# Patient Record
Sex: Female | Born: 1995 | Race: White | Hispanic: Yes | Marital: Single | Smoking: Never smoker
Health system: Southern US, Community
[De-identification: ages and names within clinical notes are randomized; demographics above are authoritative.]

---

## 2020-07-12 ENCOUNTER — Other Ambulatory Visit: Payer: Self-pay

## 2020-07-12 ENCOUNTER — Inpatient Hospital Stay (HOSPITAL_COMMUNITY)
Admission: AD | Admit: 2020-07-12 | Discharge: 2020-07-12 | Disposition: A | Discharge status: Home / Self Care | Payer: Medicaid Other | Attending: Obstetrics & Gynecology | Admitting: Obstetrics & Gynecology

## 2020-07-12 ENCOUNTER — Encounter (HOSPITAL_COMMUNITY): Payer: Self-pay | Admitting: Obstetrics & Gynecology

## 2020-07-12 DIAGNOSIS — Z3492 Encounter for supervision of normal pregnancy, unspecified, second trimester: Secondary | ICD-10-CM

## 2020-07-12 DIAGNOSIS — R109 Unspecified abdominal pain: Secondary | ICD-10-CM | POA: Diagnosis not present

## 2020-07-12 DIAGNOSIS — O26891 Other specified pregnancy related conditions, first trimester: Secondary | ICD-10-CM | POA: Diagnosis not present

## 2020-07-12 DIAGNOSIS — Z3A14 14 weeks gestation of pregnancy: Secondary | ICD-10-CM

## 2020-07-12 LAB — URINALYSIS, ROUTINE W REFLEX MICROSCOPIC
Bacteria, UA: NONE SEEN
Bilirubin Urine: NEGATIVE
Glucose, UA: NEGATIVE mg/dL
Hgb urine dipstick: NEGATIVE
Ketones, ur: NEGATIVE mg/dL
Leukocytes,Ua: NEGATIVE
Nitrite: NEGATIVE
Protein, ur: NEGATIVE mg/dL
Specific Gravity, Urine: 1.017 (ref 1.005–1.030)
pH: 8 (ref 5.0–8.0)

## 2020-07-12 LAB — WET PREP, GENITAL
Clue Cells Wet Prep HPF POC: NONE SEEN
Sperm: NONE SEEN
Trich, Wet Prep: NONE SEEN
Yeast Wet Prep HPF POC: NONE SEEN

## 2020-07-12 LAB — POCT PREGNANCY, URINE: Preg Test, Ur: POSITIVE — AB

## 2020-07-12 NOTE — MAU Note (Signed)
Chloe Gross is a 24 y.o. at [redacted]w[redacted]d here in MAU reporting: lower abdominal pain for the past 2 hours. Pain is intermittent. No bleeding or discharge.   LMP: 03/31/20  Onset of complaint: today  Pain score: 8/10  Vitals:   07/12/20 1310  BP: 112/60  Pulse: 94  Resp: 18  Temp: 97.8 F (36.6 C)  SpO2: 100%     FHT: 161  Lab orders placed from triage: UA, UPT

## 2020-07-12 NOTE — Discharge Instructions (Signed)
Segundo trimestre de embarazo Second Trimester of Pregnancy  El segundo trimestre va desde la semana14 hasta la 27 (desde el mes 4 hasta el 6). Este suele ser el momento en el que mejor se siente. En general, las nuseas matutinas han disminuido o han desaparecido completamente. Tendr ms energa y podr aumentarle el apetito. El beb en gestacin se desarrolla rpidamente. Hacia el final del sexto mes, el beb mide aproximadamente 9 pulgadas (23 cm) y pesa alrededor de 1 libras (700 g). Es probable que sienta al beb moverse entre las 18 y 20 semanas del embarazo. Siga estas indicaciones en su casa: Medicamentos  Tome los medicamentos de venta libre y los recetados solamente como se lo haya indicado el mdico. Algunos medicamentos son seguros para tomar durante el embarazo y otros no lo son.  Tome vitaminas prenatales que contengan por lo menos 600microgramos (?g) de cido flico.  Si tiene dificultad para mover el intestino (estreimiento), tome un medicamento para ablandar las heces (laxante) si su mdico se lo autoriza. Comida y bebida   Ingiera alimentos saludables de manera regular.  No coma carne cruda ni quesos sin cocinar.  Si obtiene poca cantidad de calcio de los alimentos que ingiere, consulte a su mdico sobre la posibilidad de tomar un suplemento diario de calcio.  Evite el consumo de alimentos ricos en grasas y azcares, como los alimentos fritos y los dulces.  Si tiene malestar estomacal (nuseas) o devuelve (vomita): ? Ingiera 4 o 5comidas pequeas por da en lugar de 3abundantes. ? Intente comer algunas galletitas saladas. ? Beba lquidos entre las comidas, en lugar de hacerlo durante estas.  Para evitar el estreimiento: ? Consuma alimentos ricos en fibra, como frutas y verduras frescas, cereales integrales y frijoles. ? Beba suficiente lquido para mantener el pis (orina) claro o de color amarillo plido. Actividad  Haga ejercicios solamente como se lo haya  indicado el mdico. Interrumpa la actividad fsica si comienza a tener calambres.  No haga ejercicio si hace demasiado calor, hay demasiada humedad o se encuentra en un lugar de mucha altura (altitud alta).  Evite levantar pesos excesivos.  Use zapatos con tacones bajos. Mantenga una buena postura al sentarse y pararse.  Puede continuar teniendo relaciones sexuales, a menos que el mdico le indique lo contrario. Alivio del dolor y del malestar  Use un sostn que le brinde buen soporte si sus mamas estn sensibles.  Dese baos de asiento con agua tibia para aliviar el dolor o las molestias causadas por las hemorroides. Use una crema para las hemorroides si el mdico la autoriza.  Descanse con las piernas elevadas si tiene calambres o dolor de cintura.  Si desarrolla venas hinchadas y abultadas (vrices) en las piernas: ? Use medias de compresin o medias de descanso como se lo haya indicado el mdico. ? Levante (eleve) los pies durante 15minutos, 3 o 4veces por da. ? Limite el consumo de sal en sus alimentos. Cuidado prenatal  Escriba sus preguntas. Llvelas cuando concurra a las visitas prenatales.  Concurra a todas las visitas prenatales como se lo haya indicado el mdico. Esto es importante. Seguridad  Colquese el cinturn de seguridad cuando conduzca.  Haga una lista de los nmeros de telfono de emergencia, que incluya los nmeros de telfono de familiares, amigos, el hospital, as como los departamentos de polica y bomberos. Instrucciones generales  Consulte a su mdico sobre los alimentos que debe comer o pdale que la ayude a encontrar a quien pueda aconsejarla si necesita ese servicio.    Consulte a su mdico acerca de dnde se dictan clases prenatales cerca de donde vive. Comience las clases antes del mes 6 de embarazo.  No se d baos de inmersin en agua caliente, baos turcos ni saunas.  No se haga duchas vaginales ni use tampones o toallas higinicas perfumadas.   No mantenga las piernas cruzadas durante mucho tiempo.  Vaya al dentista si an no lo hizo. Use un cepillo de cerdas suaves para cepillarse los dientes. Psese el hilo dental suavemente.  No fume, no consuma hierbas ni beba alcohol. No tome frmacos que el mdico no haya autorizado.  No consuma ningn producto que contenga nicotina o tabaco, como cigarrillos y cigarrillos electrnicos. Si necesita ayuda para dejar de fumar, consulte al mdico.  Evite el contacto con las bandejas sanitarias de los gatos y la tierra que estos animales usan. Estos elementos contienen bacterias que pueden causar defectos congnitos al beb y la posible prdida del beb (aborto espontneo) o la muerte fetal. Comunquese con un mdico si:  Tiene clicos leves o siente presin en la parte baja del vientre.  Tiene dolor al hacer pis (orinar).  Advierte un lquido con olor ftido que proviene de la vagina.  Tiene malestar estomacal (nuseas), devuelve (vomita) o tiene deposiciones acuosas (diarrea).  Sufre un dolor persistente en el abdomen.  Siente mareos. Solicite ayuda de inmediato si:  Tiene fiebre.  Tiene una prdida de lquido por la vagina.  Tiene sangrado o pequeas prdidas vaginales.  Siente dolor intenso o clicos en el abdomen.  Sube o baja de peso rpidamente.  Tiene dificultades para recuperar el aliento y siente dolor en el pecho.  Sbitamente se le hinchan mucho el rostro, las manos, los tobillos, los pies o las piernas.  No ha sentido los movimientos del beb durante una hora.  Siente un dolor de cabeza intenso que no se alivia al tomar medicamentos.  Tiene dificultad para ver. Resumen  El segundo trimestre va desde la semana14 hasta la 27, desde el mes 4 hasta el 6. Este suele ser el momento en el que mejor se siente.  Para cuidarse y cuidar a su beb en gestacin, debe comer alimentos saludables, tomar medicamentos solamente si su mdico le indica que lo haga y hacer  actividades que sean seguras para usted y su beb.  Llame al mdico si se enferma o si nota algo inusual acerca de su embarazo. Tambin llame al mdico si necesita ayuda para saber qu alimentos debe comer o si quiere saber qu actividades puede realizar de forma segura. Esta informacin no tiene como fin reemplazar el consejo del mdico. Asegrese de hacerle al mdico cualquier pregunta que tenga. Document Revised: 04/06/2017 Document Reviewed: 04/06/2017 Elsevier Patient Education  2020 Elsevier Inc.  

## 2020-07-12 NOTE — MAU Provider Note (Signed)
History     CSN: [id number]  Arrival date and time: 07/12/20 1245   Event Date/Time   First Provider Initiated Contact with Patient 07/12/20 1422      Chief Complaint  Patient presents with  . Abdominal Pain   HPI   Spanish interpretor present   Chloe Gross is 24 y.o. female G1P0 @ [redacted]w[redacted]d here in MAU with complaints of abdominal pain that started 2 hours ago.  She has not started prenatal care as of today. She attempted to call the HD and was not able to make an appointment. She was told to call adopt a mom however was not given a phone number to call. The pain in her belly comes and goes. She has not taken anything for the pain in her belly.   Patient is requesting an [place] to determine how far along she is.     OB History    Gravida  1   Para      Term      Preterm      AB      Living        SAB      IAB      Ectopic      Multiple      Live Births              History reviewed. No pertinent past medical history.  History reviewed. No pertinent surgical history.  History reviewed. No pertinent family history.  Social History   Tobacco Use  . Smoking status: Never Smoker  . Smokeless tobacco: Never Used  Substance Use Topics  . Alcohol use: Not Currently  . Drug use: Never    Allergies: No Known Allergies  No medications prior to admission.   Results for orders placed or performed during the hospital encounter of 07/12/20 (from the past 48 hour(s))  Urinalysis, Routine w reflex microscopic Urine, Clean Catch     Status: Abnormal   Collection Time: 07/12/20  1:07 PM  Result Value Ref Range   Color, Urine YELLOW YELLOW   APPearance TURBID (A) CLEAR   Specific Gravity, Urine 1.017 1.005 - 1.030   pH 8.0 5.0 - 8.0   Glucose, UA NEGATIVE NEGATIVE mg/dL   Hgb urine dipstick NEGATIVE NEGATIVE   Bilirubin Urine NEGATIVE NEGATIVE   Ketones, ur NEGATIVE NEGATIVE mg/dL   Protein, ur NEGATIVE NEGATIVE mg/dL   Nitrite NEGATIVE  NEGATIVE   Leukocytes,Ua NEGATIVE NEGATIVE   RBC / HPF 0-5 0 - 5 RBC/hpf   WBC, UA 11-20 0 - 5 WBC/hpf   Bacteria, UA NONE SEEN NONE SEEN   Squamous Epithelial / LPF 6-10 0 - 5   Amorphous Crystal PRESENT     Comment: Performed at [facility], [address], [place], [place] [address]  Pregnancy, urine POC     Status: Abnormal   Collection Time: 07/12/20  1:16 PM  Result Value Ref Range   Preg Test, Ur POSITIVE (A) NEGATIVE    Comment:        THE SENSITIVITY OF THIS METHODOLOGY IS >24 mIU/mL   Wet prep, genital     Status: Abnormal   Collection Time: 07/12/20  2:30 PM   Specimen: Cervix  Result Value Ref Range   Yeast Wet Prep HPF POC NONE SEEN NONE SEEN   Trich, Wet Prep NONE SEEN NONE SEEN   Clue Cells Wet Prep HPF POC NONE SEEN NONE SEEN   WBC, Wet Prep HPF POC  MANY (A) NONE SEEN   Sperm NONE SEEN     Comment: Performed at [facility], [address], [place], [place] [address]   Review of Systems  Constitutional: Negative for fever.  Gastrointestinal: Negative for abdominal pain.  Genitourinary: Negative for vaginal bleeding and vaginal discharge.   Physical Exam   Blood pressure 112/60, pulse 94, temperature 97.8 F (36.6 C), temperature source Oral, resp. rate 18, weight 49.4 kg, last menstrual period 03/31/2020, SpO2 100 %.  Physical Exam Constitutional:      General: She is not in acute distress.    Appearance: She is well-developed. She is not toxic-appearing.  HENT:     Head: Normocephalic.  Abdominal:     Tenderness: There is no abdominal tenderness.  Genitourinary:    Comments: Wet prep and gc collected without speculum Bimanual exam: Cervix closed Uterus non tender, enlarged  GC/Chlam, wet prep done Chaperone present for exam.    Skin:    General: Skin is warm.  Neurological:     Mental Status: She is alert and oriented to person, place, and time.    MAU Course  Procedures  Pt informed that the ultrasound is considered a  limited OB ultrasound and is not intended to be a complete ultrasound exam.  Patient also informed that the ultrasound is not being completed with the intent of assessing for fetal or placental anomalies or any pelvic abnormalities.  Explained that the purpose of today's ultrasound is to assess for  viability.  Patient acknowledges the purpose of the exam and the limitations of the study.   Active fetus   MDM  + fetal heart tones via doppler Bedside US done Interpretor present   Assessment and Plan   A:  1. Abdominal pain in pregnancy, first trimester   2. [redacted] weeks gestation of pregnancy   3. Fetal heart tones present, second trimester     P:  Discharge home in stable condition Contact information given to the [facility] Will sent a message to the office to reach out to her to establish care HD unable to see her d/t availability of appointments Return to MAU if symptoms worsen  Aveyah Greenwood, Harolyn Rutherford, NP 07/12/2020 4:24 PM

## 2020-07-13 LAB — CULTURE, OB URINE: Culture: NO GROWTH

## 2020-07-14 LAB — GC/CHLAMYDIA PROBE AMP (~~LOC~~) NOT AT ARMC
Chlamydia: NEGATIVE
Comment: NEGATIVE
Comment: NORMAL
Neisseria Gonorrhea: NEGATIVE

## 2020-07-26 NOTE — L&D Delivery Note (Signed)
OB/GYN Faculty Practice Delivery Note  Chloe Gross is a 25 y.o. G1P1001 s/p VD with repair of 2nd degree vaginal laceration at [redacted]w[redacted]d. She was admitted for latent labor and subsequently augmented with AROM and pitocin.   ROM: 7h 35m with clear fluid GBS Status: Negative Maximum Maternal Temperature: 99.2  Labor Progress: Patient arrived at 2.5cm and was augmented with pitocin.  Once reaching 5cm she was AROM'd   Delivery Date/Time: February 12, 2021 at 0954 Delivery: Called to room and patient was complete and pushing. Head delivered in ROA with manual restitution to LOT. Shoulders delivered easily and infant with good tone and spontaneous cry. Tactile stimulation given by provider and infant placed on mother's abdomen where nurse continued tactile stimulation. Infant with spontaneous cry, placed on mother's abdomen, dried and stimulated. Cord clamped x 2 after 1-minute delay, and cut by provider. Cord blood drawn. Placenta delivered spontaneously with gentle cord traction. Fundus firm with massage and Pitocin started. Labia, perineum, vagina, and cervix inspected . Vaginal inspection revealed a 2nd degree vaginal laceration that was repaired with 3-0 vicryl on CT-1.  No additional anesthetic necessary and patient tolerated the procedure well. Mother hemodynamically stable and infant skin to skin prior to provider exit.  Mother desires Depo for birth control and opts to breastfeed.  Infant weight at one hour of life: 7lbs 1.6oz, 21 in   Placenta: Disposal Complications: None Lacerations: 2nd Degree Vaginal EBL: 274 Analgesia: Epidural   Infant: Female-Hannah  APGARs 9/9  3220g (7lbs 1.6oz)  Cherre Robins, CNM  02/12/2021 11:29 AM

## 2020-08-04 ENCOUNTER — Other Ambulatory Visit: Payer: Self-pay

## 2020-08-04 ENCOUNTER — Ambulatory Visit (INDEPENDENT_AMBULATORY_CARE_PROVIDER_SITE_OTHER): Payer: Self-pay | Admitting: *Deleted

## 2020-08-04 DIAGNOSIS — Z3490 Encounter for supervision of normal pregnancy, unspecified, unspecified trimester: Secondary | ICD-10-CM | POA: Insufficient documentation

## 2020-08-04 DIAGNOSIS — Z789 Other specified health status: Secondary | ICD-10-CM

## 2020-08-04 DIAGNOSIS — O0930 Supervision of pregnancy with insufficient antenatal care, unspecified trimester: Secondary | ICD-10-CM | POA: Insufficient documentation

## 2020-08-04 NOTE — Progress Notes (Signed)
Patient was assessed and managed by nursing staff during this encounter. I have reviewed the chart and agree with the documentation and plan.   Bernerd Limbo, CNM 08/04/2020 9:27 PM

## 2020-08-04 NOTE — Patient Instructions (Signed)
-   At our [facility], we work as an integrated team, providing care to address both physical and emotional health. Your medical provider may refer you to see our Behavioral Health Clinician (BHC) on the same day you see your medical provider, as availability permits; often scheduled virtually at your convenience.  Our BHC is available to all patients, visits generally last between 20-30 minutes, but can be longer or shorter, depending on patient need. The BHC offers help with stress management, coping with symptoms of depression and anxiety, major life changes , sleep issues, changing risky behavior, grief and loss, life stress, working on personal life goals, and  behavioral health issues, as these all affect your overall health and wellness.  The BHC is NOT available for the following: FMLA paperwork, court-ordered evaluations, specialty assessments (custody or disability), letters to employers, or obtaining certification for an emotional support animal. The BHC does not provide long-term therapy. You have the right to refuse integrated behavioral health services, or to reschedule to see the BHC at a later date.  Confidentiality exception: If it is suspected that a child or disabled adult is being abused or neglected, we are required by law to report that to either Child Protective Services or Adult Protective Services.  If you have a diagnosis of Bipolar affective disorder, Schizophrenia, or recurrent Major depressive disorder, we will recommend that you establish care with a psychiatrist, as these are lifelong, chronic conditions, and we want your overall emotional health and medications to be more closely monitored. If you anticipate needing extended maternity leave due to mental health issues postpartum, it it recommended you inform your medical provider, so we can put in a referral to a  psychiatrist as soon as possible. The BHC is unable to recommend an extended maternity leave for mental  health issues. Your medical provider or BHC may refer you to a therapist for ongoing, traditional therapy, or to a psychiatrist, for medication management, if it would benefit your overall health. Depending on your insurance, you may have a copay to see the BHC. If you are uninsured, it is recommended that you apply for financial assistance. (Forms may be requested at the front desk for in-person visits, via MyChart, or request a form during a virtual visit).  If you see the BHC more than 6 times, you will have to complete a comprehensive clinical assessment interview with the BHC to resume integrated services.  For virtual visits with the BHC, you must be physically in the state of [place] at the time of the visit. For example, if you live in [place], you will have to do an in-person visit with the BHC, and your out-of-state insurance may not cover behavioral health services in [place]. f you are going out of the state or country for any reason, the BHC may see you virtually when you return to [place], but not while you are physically outside of [place].   

## 2020-08-04 NOTE — Progress Notes (Signed)
New OB Intake   Kamilla came to office for new ob intake  I explained I am completing New OB Intake today. We discussed her EDD of 01/05/2021 that is based on LMP of [id number]. Pt is G1/P0. I reviewed her allergies, medications, Medical/Surgical/OB history, and appropriate screenings. I informed her of [facility] services. Based on history, this is a/an uncomplicated pregnancy.  Concerns addressed today  Delivery Plans Plans to deliver at [facility].   Blood Pressure Cuff  N/A- will have all visits in office due to language barrier  Anatomy US Explained first scheduled Korea will be around 19 weeks. Called and scheduled for first available.  Anatomy US scheduled for 08/19/20 at 245. Pt notified to arrive at 230.  Labs Discussed Avelina Laine genetic screening with patient. Declines Panor[facility] and Horizon  Routine prenatal labs drawn today.  WIC Interested in [facility] referral. Referral sent.    First visit review I reviewed new OB appt with pt. I explained she will have a pelvic exam,  and PAP smear. Explained pt will be seen by Dan Humphreys, CNM  at first visit; encounter routed to appropriate provider.  Kiari Hosmer,RN 08/04/2020  3:27 PM

## 2020-08-05 LAB — CBC/D/PLT+RPR+RH+ABO+RUB AB...
Antibody Screen: NEGATIVE
Basophils Absolute: 0.1 10*3/uL (ref 0.0–0.2)
Basos: 1 %
EOS (ABSOLUTE): 0.1 10*3/uL (ref 0.0–0.4)
Eos: 1 %
HCV Ab: <0.1 s/co ratio (ref 0.0–0.9)
HIV Screen 4th Generation wRfx: NONREACTIVE
Hematocrit: 38 % (ref 34.0–46.6)
Hemoglobin: 13 g/dL (ref 11.1–15.9)
Hepatitis B Surface Ag: NEGATIVE
Immature Grans (Abs): 0.1 10*3/uL (ref 0.0–0.1)
Immature Granulocytes: 1 %
Lymphocytes Absolute: 2.4 10*3/uL (ref 0.7–3.1)
Lymphs: 19 %
MCH: 30.4 pg (ref 26.6–33.0)
MCHC: 34.2 g/dL (ref 31.5–35.7)
MCV: 89 fL (ref 79–97)
Monocytes Absolute: 0.7 10*3/uL (ref 0.1–0.9)
Monocytes: 5 %
Neutrophils Absolute: 9.4 10*3/uL — ABNORMAL HIGH (ref 1.4–7.0)
Neutrophils: 73 %
Platelets: 331 10*3/uL (ref 150–450)
RBC: 4.27 x10E6/uL (ref 3.77–5.28)
RDW: 13.1 % (ref 11.7–15.4)
RPR Ser Ql: NONREACTIVE
Rh Factor: POSITIVE
Rubella Antibodies, IGG: 2.43 index (ref >0.99)
WBC: 12.6 10*3/uL — ABNORMAL HIGH (ref 3.4–10.8)

## 2020-08-05 LAB — HEMOGLOBIN A1C
Est. average glucose Bld gHb Est-mCnc: 100 mg/dL
Hgb A1c MFr Bld: 5.1 % (ref 4.8–5.6)

## 2020-08-05 LAB — HCV INTERPRETATION

## 2020-08-07 ENCOUNTER — Ambulatory Visit (INDEPENDENT_AMBULATORY_CARE_PROVIDER_SITE_OTHER): Payer: Self-pay | Admitting: Certified Nurse Midwife

## 2020-08-07 ENCOUNTER — Encounter: Payer: Self-pay | Admitting: Certified Nurse Midwife

## 2020-08-07 ENCOUNTER — Other Ambulatory Visit: Payer: Self-pay

## 2020-08-07 VITALS — BP 106/70 | HR 101 | Wt 112.4 lb

## 2020-08-07 DIAGNOSIS — Z3490 Encounter for supervision of normal pregnancy, unspecified, unspecified trimester: Secondary | ICD-10-CM

## 2020-08-07 DIAGNOSIS — Z3A18 18 weeks gestation of pregnancy: Secondary | ICD-10-CM

## 2020-08-07 NOTE — Patient Instructions (Signed)
Segundo trimestre de embarazo Second Trimester of Pregnancy  El segundo trimestre de embarazo va desde la semana 13 hasta la semana 27. Tambin se dice que va desde el mes 4 hasta el mes 6 de embarazo. Este suele ser el momento en el que mejor se siente. Durante el segundo trimestre:  Las nuseas del embarazo han disminuido o han desaparecido.  Usted puede tener ms energa.  Usted puede tener hambre con ms frecuencia. En esta poca, el beb en gestacin (feto) crece muy rpido. Hacia el final del sexto mes, el beb en gestacin puede medir aproximadamente 12 pulgadas y pesar alrededor de 1 libras. Es probable que comience a sentir que el beb se mueve entre las 16 y las 20 semanas de embarazo. Cambios en el cuerpo durante el segundo trimestre Su organismo contina atravesando por muchos cambios durante este perodo. Los cambios varan y generalmente vuelven a la normalidad despus del nacimiento del beb. Cambios fsicos  Aumentar ms peso.  Podrn aparecer las primeras estras en las caderas, el vientre (abdomen) y las mamas.  Las mamas crecern y pueden doler.  Pueden aparecer zonas oscuras o manchas en el rostro.  Es posible que se forme una lnea oscura desde el ombligo hasta la zona del pubis (linea nigra).  Tal vez haya cambios en el cabello. Cambios en la salud  Es posible que tenga dolores de cabeza.  Es posible que tenga acidez estomacal.  Es posible que tenga dificultades para defecar (estreimiento).  Es posible que tenga hemorroides o venas abultadas e hinchadas (venas varicosas).  Las encas pueden sangrarle.  Es posible que haga pis (orine) con mayor frecuencia.  Puede sentir dolor en la espalda. Siga estas instrucciones en su casa: Medicamentos  Use los medicamentos de venta libre y los recetados solamente como se lo haya indicado el mdico. Algunos medicamentos no son seguros durante el embarazo.  Tome vitaminas prenatales que contengan por lo menos  600microgramos (mcg) de cido flico. Comida y bebida  Consuma comidas saludables que incluyan lo siguiente: ? Frutas y verduras frescas. ? Cereales integrales. ? Buenas fuentes de protenas, como carne, huevos y tofu. ? Productos lcteos con bajo contenido de grasa.  Evite la carne cruda y el jugo, la leche y el queso sin pasteurizar.  Es posible que deba tomar medidas para prevenir o tratar los problemas para defecar: ? Beber suficiente lquido para mantener el pis (orina) de color amarillo plido. ? Come alimentos ricos en fibra. Entre ellos, frijoles, cereales integrales y frutas y verduras frescas. ? Limitar los alimentos con alto contenido de grasa y azcar. Estos incluyen alimentos fritos o dulces. Actividad  Haga ejercicios solamente como se lo haya indicado el mdico. La mayora de las personas pueden realizar su actividad fsica habitual durante el embarazo. Intente realizar como mnimo 30minutos de actividad fsica por lo menos 5das a la semana.  Deje de hacer ejercicio si tiene dolor o clicos en el vientre o en la zona lumbar.  No haga ejercicio si hace demasiado calor, hay demasiada humedad o se encuentra en un lugar de mucha altura (altitud elevada).  Evite levantar pesos excesivos.  Si lo desea, puede continuar teniendo relaciones sexuales, a menos que el mdico le indique lo contrario. Alivio del dolor y del malestar  Use un sostn que le brinde buen soporte si le duelen las mamas.  Dese baos de asiento con agua tibia para aliviar el dolor o las molestias causadas por las hemorroides. Use una crema para las hemorroides si   el mdico la autoriza.  Descanse con las piernas levantadas (elevadas) si tiene calambres en las piernas o dolor en la parte baja de la espalda.  Si desarrolla venas abultadas en las piernas: ? Use medias de compresin segn las indicaciones de su mdico. ? Levante los pies durante 15minutos, 3 o 4veces por da. ? Limite la sal en sus  alimentos. Seguridad  Use el cinturn de seguridad en todo momento mientras vaya en auto.  Hable con el mdico si alguien le est haciendo dao o gritando mucho. Estilo de vida  No se d baos de inmersin en agua caliente, baos turcos ni saunas.  No se haga duchas vaginales. No use tampones ni toallas higinicas perfumadas.  Evite el contacto con las bandejas sanitarias de los gatos y la tierra que estos animales usan. Estos contienen grmenes que pueden daar al beb y causar la prdida del beb ya sea aborto espontneo o muerte fetal.  No consuma medicamentos a base de hierbas, drogas ilegales, ni medicamentos que el mdico no haya autorizado. No beba alcohol.  No fume ni consuma ningn producto que contenga nicotina o tabaco. Si necesita ayuda para dejar de fumar, consulte al mdico. Instrucciones generales  Cumpla con todas las visitas de seguimiento. Esto es importante.  Consulte a su mdico acerca de dnde se dictan clases prenatales cerca de donde vive.  Consulte a su mdico sobre los alimentos que debe comer o pdale que la ayude a encontrar a un asesor. Dnde buscar ms informacin  American Pregnancy Association (Asociacin Americana del Embarazo): americanpregnancy.org  American College of Obstetricians and Gynecologists (Colegio Estadounidense de Obstetras y Gineclogos): www.acog.org  Office on Women's Health (Oficina para la Salud de la Mujer): womenshealth.gov/pregnancy Comunquese con un mdico si:  Tiene un dolor de cabeza que no desaparece despus de tomar analgsicos.  Nota cambios en la visin o ve manchas delante de los ojos.  Tiene clicos o siente presin o dolor leves en la parte baja del vientre.  Sigue sintiendo como si fuera a vomitar (nuseas), vomita o hace deposiciones acuosas (diarrea).  Advierte lquido con mal olor que proviene de la vagina.  Siente dolor al orinar o hace orina con mal olor.  Tiene una gran hinchazn en la cara, las  manos, las piernas, los tobillos o los pies.  Tiene fiebre. Solicite ayuda de inmediato si:  Tiene una prdida de lquido por la vagina.  Tiene sangrado o pequeas prdidas vaginales.  Tiene clicos o dolor muy intensos en el vientre.  Tiene dificultad para respirar.  Sientes dolor en el pecho.  Se desmaya.  No ha sentido que el beb se moviera durante el perodo de tiempo que le dijo el mdico.  Tiene dolor, hinchazn o enrojecimiento nuevos en un brazo o una pierna o se produce un aumento de alguno de estos sntomas. Resumen  El segundo trimestre de embarazo va desde la semana 13 hasta la 27 (desde el mes 4 hasta el 6).  Consuma comidas saludables.  Haga ejercicios tal como le indic el mdico. La mayora de las personas pueden realizar su actividad fsica habitual durante el embarazo.  No consuma medicamentos a base de hierbas, drogas ilegales, ni medicamentos que el mdico no haya autorizado. No beba alcohol.  Llame al mdico si se enferma o si nota algo inusual acerca de su embarazo. Esta informacin no tiene como fin reemplazar el consejo del mdico. Asegrese de hacerle al mdico cualquier pregunta que tenga. Document Revised: 01/25/2020 Document Reviewed: 01/25/2020 Elsevier Patient Education    2021 Elsevier Inc.  

## 2020-08-07 NOTE — Progress Notes (Signed)
History:   Chloe Gross is a 25 y.o. G1P0 at [redacted]w[redacted]d by LMP being seen today for her first obstetrical visit.  Her obstetrical history is not significant.Patient does intend to breast and bottle feed. Pregnancy history fully reviewed.   Patient reports occasional nausea, not enough to cause vomiting and no other complaints.      HISTORY: OB History  Gravida Para Term Preterm AB Living  1 0 0 0 0 0  SAB IAB Ectopic Multiple Live Births  0 0 0 0 0    # Outcome Date GA Lbr Len/2nd Weight Sex Delivery Anes PTL Lv  1 Current             Pt has never had a pap smear (age), will perform at postpartum visit.  History reviewed. No pertinent past medical history. History reviewed. No pertinent surgical history. History reviewed. No pertinent family history. Social History   Tobacco Use  . Smoking status: Never Smoker  . Smokeless tobacco: Never Used  Vaping Use  . Vaping Use: Never used  Substance Use Topics  . Alcohol use: Not Currently    Comment: at times  . Drug use: Never   No Known Allergies Current Outpatient Medications on File Prior to Visit  Medication Sig Dispense Refill  . Prenatal Vit-Fe Fumarate-FA (PRENATAL VITAMINS PO) Take 1 tablet by mouth daily.     No current facility-administered medications on file prior to visit.    Review of Systems Pertinent items noted in HPI and remainder of comprehensive ROS otherwise negative. Physical Exam:   Vitals:   08/07/20 1401  BP: 106/70  Pulse: (!) 101  Weight: 112 lb 6.4 oz (51 kg)   Fetal Heart Rate (bpm): 145  Uterus:  Fundal Height: 18 cm  Pelvic Exam: Perineum: Pelvic exam deferred   Vulva:    Vagina:     Cervix:    Adnexa:    Bony Pelvis: average  System: General: well-developed, well-nourished female in no acute distress   Breasts:  normal appearance, no masses or tenderness bilaterally   Skin: normal coloration and turgor, no rashes   Neurologic: oriented, normal, negative, normal mood    Extremities: normal strength, tone, and muscle mass, ROM of all joints is normal   HEENT PERRLA, extraocular movement intact and sclera clear, anicteric   Mouth/Teeth mucous membranes moist, pharynx normal without lesions and dental hygiene good   Neck supple and no masses   Cardiovascular: regular rate and rhythm   Respiratory:  no respiratory distress, normal breath sounds   Abdomen: soft, non-tender; bowel sounds normal; no masses,  no organomegaly    Assessment:    Pregnancy: G1P0 Patient Active Problem List   Diagnosis Date Noted  . Supervision of low-risk pregnancy 08/04/2020  . Language barrier 08/04/2020  . Late prenatal care affecting pregnancy, antepartum 08/04/2020     Plan:    1. Initial encounter for supervision of low-risk pregnancy, antepartum - Initial labs reviewed (drawn at [facility] intake visit) - Continue prenatal vitamins. - Anticipatory guidance about prenatal visits given including labs, ultrasounds, and testing. - Discussed usage of Babyscripts and virtual visits as additional source of managing and completing prenatal visits in midst of coronavirus and pandemic.   - Encouraged to complete MyChart Registration for her ability to review results, send requests, and have questions addressed.  - The nature of [facility] [facility] with multiple MDs and Advanced Practice Providers was explained to patient; also emphasized that  residents, students are part of our team. - Routine obstetric precautions reviewed. Encouraged to seek out care at office or emergency room [facility] MAU preferred) for urgent and/or emergent concerns. - Reassured that live or virtual interpretation is available at all office and hospital visits   2. [redacted] weeks gestation of pregnancy - Pt doing well, not feeling movement yet - movement heart/felt during doppler - Genetic Screening discussed, First trimester screen, Quad screen and NIPS: declined. Ultrasound  discussed; fetal anatomic survey: ordered for 08/19/20.  IReturn in about 4 weeks (around 09/04/2020) for IN-PERSON, LOB.     Edd Arbour, MSN, CNM, IBCLC Certified Nurse Midwife, [facility]

## 2020-08-14 DIAGNOSIS — O99311 Alcohol use complicating pregnancy, first trimester: Secondary | ICD-10-CM | POA: Diagnosis not present

## 2020-08-14 DIAGNOSIS — Z789 Other specified health status: Secondary | ICD-10-CM | POA: Diagnosis not present

## 2020-08-14 DIAGNOSIS — Z3402 Encounter for supervision of normal first pregnancy, second trimester: Secondary | ICD-10-CM | POA: Diagnosis not present

## 2020-08-14 DIAGNOSIS — O0930 Supervision of pregnancy with insufficient antenatal care, unspecified trimester: Secondary | ICD-10-CM | POA: Diagnosis not present

## 2020-08-19 ENCOUNTER — Other Ambulatory Visit: Payer: Self-pay | Admitting: Certified Nurse Midwife

## 2020-08-19 ENCOUNTER — Ambulatory Visit: Payer: Medicaid Other | Attending: Certified Nurse Midwife

## 2020-08-19 ENCOUNTER — Other Ambulatory Visit: Payer: Self-pay

## 2020-08-19 DIAGNOSIS — Z789 Other specified health status: Secondary | ICD-10-CM | POA: Diagnosis not present

## 2020-08-19 DIAGNOSIS — Z3490 Encounter for supervision of normal pregnancy, unspecified, unspecified trimester: Secondary | ICD-10-CM

## 2020-08-19 DIAGNOSIS — Z603 Acculturation difficulty: Secondary | ICD-10-CM

## 2020-08-20 ENCOUNTER — Other Ambulatory Visit: Payer: Self-pay | Admitting: *Deleted

## 2020-08-20 DIAGNOSIS — Z3492 Encounter for supervision of normal pregnancy, unspecified, second trimester: Secondary | ICD-10-CM

## 2020-09-04 ENCOUNTER — Encounter: Payer: Self-pay | Admitting: Certified Nurse Midwife

## 2020-09-11 ENCOUNTER — Other Ambulatory Visit: Payer: Self-pay

## 2020-09-16 ENCOUNTER — Other Ambulatory Visit: Payer: Self-pay

## 2020-09-17 ENCOUNTER — Other Ambulatory Visit: Payer: Self-pay | Admitting: *Deleted

## 2020-09-17 ENCOUNTER — Ambulatory Visit: Payer: Self-pay | Admitting: *Deleted

## 2020-09-17 ENCOUNTER — Encounter: Payer: Self-pay | Admitting: *Deleted

## 2020-09-17 ENCOUNTER — Ambulatory Visit: Payer: Medicaid Other | Attending: Obstetrics

## 2020-09-17 ENCOUNTER — Other Ambulatory Visit: Payer: Self-pay

## 2020-09-17 DIAGNOSIS — O0930 Supervision of pregnancy with insufficient antenatal care, unspecified trimester: Secondary | ICD-10-CM | POA: Insufficient documentation

## 2020-09-17 DIAGNOSIS — O322XX0 Maternal care for transverse and oblique lie, not applicable or unspecified: Secondary | ICD-10-CM | POA: Diagnosis not present

## 2020-09-17 DIAGNOSIS — Z363 Encounter for antenatal screening for malformations: Secondary | ICD-10-CM

## 2020-09-17 DIAGNOSIS — Z362 Encounter for other antenatal screening follow-up: Secondary | ICD-10-CM

## 2020-09-17 DIAGNOSIS — Z3A19 19 weeks gestation of pregnancy: Secondary | ICD-10-CM | POA: Diagnosis not present

## 2020-09-17 DIAGNOSIS — Z789 Other specified health status: Secondary | ICD-10-CM

## 2020-09-17 DIAGNOSIS — Z3492 Encounter for supervision of normal pregnancy, unspecified, second trimester: Secondary | ICD-10-CM | POA: Insufficient documentation

## 2020-09-17 DIAGNOSIS — O358XX0 Maternal care for other (suspected) fetal abnormality and damage, not applicable or unspecified: Secondary | ICD-10-CM

## 2020-09-17 DIAGNOSIS — Z3687 Encounter for antenatal screening for uncertain dates: Secondary | ICD-10-CM

## 2020-10-16 ENCOUNTER — Ambulatory Visit: Payer: Medicaid Other | Admitting: *Deleted

## 2020-10-16 ENCOUNTER — Encounter: Payer: Self-pay | Admitting: *Deleted

## 2020-10-16 ENCOUNTER — Ambulatory Visit: Payer: Medicaid Other | Attending: Obstetrics and Gynecology

## 2020-10-16 ENCOUNTER — Other Ambulatory Visit: Payer: Self-pay

## 2020-10-16 DIAGNOSIS — Z789 Other specified health status: Secondary | ICD-10-CM | POA: Insufficient documentation

## 2020-10-16 DIAGNOSIS — Z3687 Encounter for antenatal screening for uncertain dates: Secondary | ICD-10-CM

## 2020-10-16 DIAGNOSIS — Z3A23 23 weeks gestation of pregnancy: Secondary | ICD-10-CM | POA: Diagnosis not present

## 2020-10-16 DIAGNOSIS — O358XX0 Maternal care for other (suspected) fetal abnormality and damage, not applicable or unspecified: Secondary | ICD-10-CM | POA: Diagnosis not present

## 2020-10-16 DIAGNOSIS — Z362 Encounter for other antenatal screening follow-up: Secondary | ICD-10-CM | POA: Diagnosis present

## 2020-10-16 DIAGNOSIS — O0932 Supervision of pregnancy with insufficient antenatal care, second trimester: Secondary | ICD-10-CM

## 2020-10-16 DIAGNOSIS — O0930 Supervision of pregnancy with insufficient antenatal care, unspecified trimester: Secondary | ICD-10-CM | POA: Diagnosis present

## 2020-11-20 DIAGNOSIS — Z789 Other specified health status: Secondary | ICD-10-CM | POA: Diagnosis not present

## 2020-11-20 DIAGNOSIS — O99311 Alcohol use complicating pregnancy, first trimester: Secondary | ICD-10-CM | POA: Diagnosis not present

## 2020-11-20 DIAGNOSIS — O0930 Supervision of pregnancy with insufficient antenatal care, unspecified trimester: Secondary | ICD-10-CM | POA: Diagnosis not present

## 2020-11-20 DIAGNOSIS — Z3403 Encounter for supervision of normal first pregnancy, third trimester: Secondary | ICD-10-CM | POA: Diagnosis not present

## 2020-12-08 DIAGNOSIS — O365930 Maternal care for other known or suspected poor fetal growth, third trimester, not applicable or unspecified: Secondary | ICD-10-CM | POA: Diagnosis not present

## 2021-01-15 DIAGNOSIS — O2603 Excessive weight gain in pregnancy, third trimester: Secondary | ICD-10-CM | POA: Diagnosis not present

## 2021-01-15 DIAGNOSIS — Z3403 Encounter for supervision of normal first pregnancy, third trimester: Secondary | ICD-10-CM | POA: Diagnosis not present

## 2021-01-15 LAB — OB RESULTS CONSOLE GBS: GBS: NEGATIVE

## 2021-01-30 ENCOUNTER — Encounter (HOSPITAL_COMMUNITY): Payer: Self-pay | Admitting: Obstetrics & Gynecology

## 2021-01-30 ENCOUNTER — Inpatient Hospital Stay (HOSPITAL_COMMUNITY)
Admission: AD | Admit: 2021-01-30 | Discharge: 2021-01-31 | Disposition: A | Discharge status: Home / Self Care | Payer: Medicaid Other | Attending: Obstetrics & Gynecology | Admitting: Obstetrics & Gynecology

## 2021-01-30 DIAGNOSIS — Z3493 Encounter for supervision of normal pregnancy, unspecified, third trimester: Secondary | ICD-10-CM

## 2021-01-30 DIAGNOSIS — U071 COVID-19: Secondary | ICD-10-CM | POA: Diagnosis not present

## 2021-01-30 DIAGNOSIS — O98513 Other viral diseases complicating pregnancy, third trimester: Secondary | ICD-10-CM | POA: Diagnosis not present

## 2021-01-30 DIAGNOSIS — Z789 Other specified health status: Secondary | ICD-10-CM

## 2021-01-30 DIAGNOSIS — Z3A39 39 weeks gestation of pregnancy: Secondary | ICD-10-CM | POA: Insufficient documentation

## 2021-01-30 DIAGNOSIS — O0930 Supervision of pregnancy with insufficient antenatal care, unspecified trimester: Secondary | ICD-10-CM

## 2021-01-30 DIAGNOSIS — O471 False labor at or after 37 completed weeks of gestation: Secondary | ICD-10-CM

## 2021-01-30 DIAGNOSIS — Z3A37 37 weeks gestation of pregnancy: Secondary | ICD-10-CM | POA: Insufficient documentation

## 2021-01-30 LAB — BASIC METABOLIC PANEL
Anion gap: 8 (ref 5–15)
BUN: <5 mg/dL — ABNORMAL LOW (ref 6–20)
CO2: 21 mmol/L — ABNORMAL LOW (ref 22–32)
Calcium: 8.8 mg/dL — ABNORMAL LOW (ref 8.9–10.3)
Chloride: 103 mmol/L (ref 98–111)
Creatinine, Ser: 0.48 mg/dL (ref 0.44–1.00)
GFR, Estimated: >60 mL/min (ref >60)
Glucose, Bld: 104 mg/dL — ABNORMAL HIGH (ref 70–99)
Potassium: 3.8 mmol/L (ref 3.5–5.1)
Sodium: 132 mmol/L — ABNORMAL LOW (ref 135–145)

## 2021-01-30 LAB — CBC WITH DIFFERENTIAL/PLATELET
Abs Immature Granulocytes: 0.14 10*3/uL — ABNORMAL HIGH (ref 0.00–0.07)
Basophils Absolute: 0 10*3/uL (ref 0.0–0.1)
Basophils Relative: 0 %
Eosinophils Absolute: 0 10*3/uL (ref 0.0–0.5)
Eosinophils Relative: 0 %
HCT: 34.5 % — ABNORMAL LOW (ref 36.0–46.0)
Hemoglobin: 11.4 g/dL — ABNORMAL LOW (ref 12.0–15.0)
Immature Granulocytes: 1 %
Lymphocytes Relative: 7 %
Lymphs Abs: 0.7 10*3/uL (ref 0.7–4.0)
MCH: 29.2 pg (ref 26.0–34.0)
MCHC: 33 g/dL (ref 30.0–36.0)
MCV: 88.5 fL (ref 80.0–100.0)
Monocytes Absolute: 1 10*3/uL (ref 0.1–1.0)
Monocytes Relative: 10 %
Neutro Abs: 8.6 10*3/uL — ABNORMAL HIGH (ref 1.7–7.7)
Neutrophils Relative %: 82 %
Platelets: 242 10*3/uL (ref 150–400)
RBC: 3.9 MIL/uL (ref 3.87–5.11)
RDW: 13.1 % (ref 11.5–15.5)
WBC: 10.4 10*3/uL (ref 4.0–10.5)
nRBC: 0.2 % (ref 0.0–0.2)

## 2021-01-30 LAB — RESP PANEL BY RT-PCR (FLU A&B, COVID) ARPGX2
Influenza A by PCR: NEGATIVE
Influenza B by PCR: NEGATIVE
SARS Coronavirus 2 by RT PCR: POSITIVE — AB

## 2021-01-30 MED ORDER — ACETAMINOPHEN 500 MG PO TABS
1000.0000 mg | ORAL_TABLET | Freq: Once | ORAL | Status: AC
  Administered 2021-01-30: 1000 mg via ORAL
  Filled 2021-01-30: qty 2

## 2021-01-30 MED ORDER — LACTATED RINGERS IV BOLUS
1000.0000 mL | Freq: Once | INTRAVENOUS | Status: AC
  Administered 2021-01-30: 1000 mL via INTRAVENOUS

## 2021-01-30 NOTE — MAU Note (Signed)
PT SAYS UC STRONG  SINCE - 330PM. PNC WITH HD VE YESTERDAY -  1 CM DENIES HSV GBS- UNSURE

## 2021-01-31 DIAGNOSIS — Z3A39 39 weeks gestation of pregnancy: Secondary | ICD-10-CM

## 2021-01-31 DIAGNOSIS — O471 False labor at or after 37 completed weeks of gestation: Secondary | ICD-10-CM

## 2021-01-31 DIAGNOSIS — O98513 Other viral diseases complicating pregnancy, third trimester: Secondary | ICD-10-CM | POA: Diagnosis not present

## 2021-01-31 DIAGNOSIS — U071 COVID-19: Secondary | ICD-10-CM

## 2021-01-31 NOTE — MAU Provider Note (Signed)
History     CSN: [id number]  Arrival date and time: 01/30/21 1851     Chief Complaint  Patient presents with   Contractions   Chloe Gross is a 25 y.o. year old G1P0 female at [redacted]w[redacted]d weeks gestation who presents to MAU reporting contractions, fever, congestion and not feeling well since this morning. She denies any contact with anyone with known or suspected COVID-19. She is vaccinated (#1: 06/18/2020 abd #2 07/09/2020 - both Pfizer). She receives her [facility] with [facility]; next appt is on 02/05/2021. Her friend is present and contributing to the history taking.    OB History     Gravida  1   Para      Term      Preterm      AB      Living         SAB      IAB      Ectopic      Multiple      Live Births              History reviewed. No pertinent past medical history.  History reviewed. No pertinent surgical history.  No family history on file.  Social History   Tobacco Use   Smoking status: Never   Smokeless tobacco: Never  Vaping Use   Vaping Use: Never used  Substance Use Topics   Alcohol use: Not Currently    Comment: at times   Drug use: Never    Allergies: No Known Allergies  Medications Prior to Admission  Medication Sig Dispense Refill Last Dose   Prenatal Vit-Fe Fumarate-FA (PRENATAL VITAMINS PO) Take 1 tablet by mouth daily.       Review of Systems  Constitutional:  Positive for appetite change, chills, diaphoresis, fatigue and fever.  HENT:  Positive for congestion.   Eyes: Negative.   Respiratory:  Positive for shortness of breath.   Cardiovascular: Negative.   Gastrointestinal: Negative.   Endocrine: Negative.   Genitourinary:  Positive for pelvic pain (contractions).  Musculoskeletal: Negative.   Skin: Negative.   Allergic/Immunologic: Negative.   Neurological: Negative.   Hematological: Negative.   Psychiatric/Behavioral: Negative.    Physical Exam   Patient Vitals for the past 24 hrs:  BP Temp Temp src  Pulse Resp SpO2 Weight  01/31/21 0026 (!) 95/52 98.1 F (36.7 C) Oral (!) 116 14 -- --  01/31/21 0005 -- -- -- -- -- 99 % --  01/31/21 0000 -- -- -- -- -- 98 % --  01/30/21 2355 -- -- -- -- -- 97 % --  01/30/21 2350 -- -- -- -- -- 97 % --  01/30/21 2345 -- -- -- -- -- 95 % --  01/30/21 2340 -- -- -- -- -- 97 % --  01/30/21 2335 -- -- -- -- -- 98 % --  01/30/21 2330 -- -- -- -- -- 98 % --  01/30/21 2325 -- -- -- -- -- 96 % --  01/30/21 2320 -- -- -- -- -- 98 % --  01/30/21 2315 -- -- -- -- -- 97 % --  01/30/21 2310 -- -- -- -- -- 97 % --  01/30/21 2305 -- -- -- -- -- 99 % --  01/30/21 2300 -- -- -- -- -- 99 % --  01/30/21 2138 -- 99.4 F (37.4 C) Oral -- -- -- --  01/30/21 1923 103/60 (!) 101.6 F (38.7 C) Oral (!) 149 20 -- --  01/30/21 1917 -- -- -- -- -- --  65 kg     Physical Exam Vitals and nursing note reviewed. Exam conducted with a chaperone present.  Constitutional:      Appearance: Normal appearance. She is normal weight.  HENT:     Head: Normocephalic and atraumatic.     Nose: Congestion present.  Cardiovascular:     Rate and Rhythm: Tachycardia present.     Pulses: Normal pulses.     Heart sounds: Normal heart sounds.  Pulmonary:     Effort: Pulmonary effort is normal.     Breath sounds: Normal breath sounds.  Abdominal:     Palpations: Abdomen is soft.  Genitourinary:    Comments: Dilation: 1 Effacement (%): 80 Station: -3 Exam by: Carloyn Jaeger, CNM  Musculoskeletal:     Cervical back: Normal range of motion.  Neurological:     Mental Status: She is alert.    MAU Course  Procedures  MDM CBC w/Diff COVID/Flu A&B swab BMP LR 1000 ml bolus x 2 *Consult with Dr. Charlotta Newton @ 2345 - notified of patient's complaints, assessments, lab & U/S results, recommended tx plan offer Paxlovid, f/u with [facility] as directed - ok to d/c home  Results for orders placed or performed during the hospital encounter of 01/30/21 (from the past 24 hour(s))  CBC with Differential      Status: Abnormal   Collection Time: 01/30/21  7:58 PM  Result Value Ref Range   WBC 10.4 4.0 - 10.5 K/uL   RBC 3.90 3.87 - 5.11 MIL/uL   Hemoglobin 11.4 (L) 12.0 - 15.0 g/dL   HCT 43.3 (L) 29.5 - 18.8 %   MCV 88.5 80.0 - 100.0 fL   MCH 29.2 26.0 - 34.0 pg   MCHC 33.0 30.0 - 36.0 g/dL   RDW 41.6 60.6 - 30.1 %   Platelets 242 150 - 400 K/uL   nRBC 0.2 0.0 - 0.2 %   Neutrophils Relative % 82 %   Neutro Abs 8.6 (H) 1.7 - 7.7 K/uL   Lymphocytes Relative 7 %   Lymphs Abs 0.7 0.7 - 4.0 K/uL   Monocytes Relative 10 %   Monocytes Absolute 1.0 0.1 - 1.0 K/uL   Eosinophils Relative 0 %   Eosinophils Absolute 0.0 0.0 - 0.5 K/uL   Basophils Relative 0 %   Basophils Absolute 0.0 0.0 - 0.1 K/uL   Immature Granulocytes 1 %   Abs Immature Granulocytes 0.14 (H) 0.00 - 0.07 K/uL  Basic metabolic panel     Status: Abnormal   Collection Time: 01/30/21  7:58 PM  Result Value Ref Range   Sodium 132 (L) 135 - 145 mmol/L   Potassium 3.8 3.5 - 5.1 mmol/L   Chloride 103 98 - 111 mmol/L   CO2 21 (L) 22 - 32 mmol/L   Glucose, Bld 104 (H) 70 - 99 mg/dL   BUN <5 (L) 6 - 20 mg/dL   Creatinine, Ser 6.01 0.44 - 1.00 mg/dL   Calcium 8.8 (L) 8.9 - 10.3 mg/dL   GFR, Estimated >09 >32 mL/min   Anion gap 8 5 - 15  Resp Panel by RT-PCR (Flu A&B, Covid) Nasopharyngeal Swab     Status: Abnormal   Collection Time: 01/30/21  8:22 PM   Specimen: Nasopharyngeal Swab; Nasopharyngeal(NP) swabs in vial transport medium  Result Value Ref Range   SARS Coronavirus 2 by RT PCR POSITIVE (A) NEGATIVE   Influenza A by PCR NEGATIVE NEGATIVE   Influenza B by PCR NEGATIVE NEGATIVE    Assessment and Plan  False labor after 37 weeks of gestation without delivery  - Information provided on BH ctxs   COVID-19 affecting pregnancy in third trimester  - Information provided on pregnancy and COVID-19, 10 things to manage sx of COVID at home, isolation vs quaratine - Offered Paxlovid Rx after discussion of r/b >>declined - Safe  Medications in Pregnancy list given - Advised to take OTC for colds, stay well-hydrated, wear a mask if has to be around people she lives with, drink OJ   [redacted] weeks gestation of pregnancy  Language barrier affecting health care  - [place] in-person interpreter used for assessment - [employer] Video Spanish Interpreter, Warden Fillers [phone] used for entire visit   - Discharge patient - Call [facility] on Monday 02/02/21 to see if they want you to come in person to next appt - Patient verbalized an understanding of the plan of care and agrees.   Raelyn Mora, CNM 01/30/2021, 9:40 PM

## 2021-01-31 NOTE — Discharge Instructions (Signed)

## 2021-02-09 ENCOUNTER — Encounter (HOSPITAL_COMMUNITY): Payer: Self-pay | Admitting: *Deleted

## 2021-02-09 ENCOUNTER — Telehealth (HOSPITAL_COMMUNITY): Payer: Self-pay | Admitting: *Deleted

## 2021-02-09 DIAGNOSIS — O480 Post-term pregnancy: Secondary | ICD-10-CM | POA: Diagnosis not present

## 2021-02-09 DIAGNOSIS — Z3403 Encounter for supervision of normal first pregnancy, third trimester: Secondary | ICD-10-CM | POA: Diagnosis not present

## 2021-02-09 DIAGNOSIS — O2603 Excessive weight gain in pregnancy, third trimester: Secondary | ICD-10-CM | POA: Diagnosis not present

## 2021-02-09 DIAGNOSIS — O0930 Supervision of pregnancy with insufficient antenatal care, unspecified trimester: Secondary | ICD-10-CM | POA: Diagnosis not present

## 2021-02-09 NOTE — Telephone Encounter (Signed)
Interpreter number [phone]

## 2021-02-10 DIAGNOSIS — Z3403 Encounter for supervision of normal first pregnancy, third trimester: Secondary | ICD-10-CM | POA: Diagnosis not present

## 2021-02-11 ENCOUNTER — Encounter (HOSPITAL_COMMUNITY): Payer: Self-pay | Admitting: Obstetrics & Gynecology

## 2021-02-11 ENCOUNTER — Inpatient Hospital Stay (HOSPITAL_COMMUNITY)
Admission: AD | Admit: 2021-02-11 | Discharge: 2021-02-11 | Disposition: A | Discharge status: Home / Self Care | Payer: Medicaid Other | Source: Home / Self Care | Attending: Obstetrics & Gynecology | Admitting: Obstetrics & Gynecology

## 2021-02-11 ENCOUNTER — Other Ambulatory Visit: Payer: Self-pay

## 2021-02-11 ENCOUNTER — Inpatient Hospital Stay (HOSPITAL_COMMUNITY)
Admission: AD | Admit: 2021-02-11 | Discharge: 2021-02-14 | DRG: 807 | Disposition: A | Discharge status: Home / Self Care | Payer: Medicaid Other | Attending: Obstetrics and Gynecology | Admitting: Obstetrics and Gynecology

## 2021-02-11 DIAGNOSIS — O480 Post-term pregnancy: Secondary | ICD-10-CM | POA: Insufficient documentation

## 2021-02-11 DIAGNOSIS — Z8616 Personal history of COVID-19: Secondary | ICD-10-CM

## 2021-02-11 DIAGNOSIS — T81509A Unspecified complication of foreign body accidentally left in body following unspecified procedure, initial encounter: Secondary | ICD-10-CM

## 2021-02-11 DIAGNOSIS — O471 False labor at or after 37 completed weeks of gestation: Secondary | ICD-10-CM | POA: Insufficient documentation

## 2021-02-11 DIAGNOSIS — O479 False labor, unspecified: Secondary | ICD-10-CM

## 2021-02-11 DIAGNOSIS — Z3A40 40 weeks gestation of pregnancy: Secondary | ICD-10-CM

## 2021-02-11 LAB — TYPE AND SCREEN
ABO/RH(D): O POS
Antibody Screen: NEGATIVE

## 2021-02-11 LAB — CBC
HCT: 37.5 % (ref 36.0–46.0)
Hemoglobin: 13 g/dL (ref 12.0–15.0)
MCH: 30.4 pg (ref 26.0–34.0)
MCHC: 34.7 g/dL (ref 30.0–36.0)
MCV: 87.8 fL (ref 80.0–100.0)
Platelets: 369 10*3/uL (ref 150–400)
RBC: 4.27 MIL/uL (ref 3.87–5.11)
RDW: 13.7 % (ref 11.5–15.5)
WBC: 13.1 10*3/uL — ABNORMAL HIGH (ref 4.0–10.5)
nRBC: 0 % (ref 0.0–0.2)

## 2021-02-11 MED ORDER — TERBUTALINE SULFATE 1 MG/ML IJ SOLN: 0.2500 mg | Freq: Once | INTRAMUSCULAR | Status: DC | PRN

## 2021-02-11 MED ORDER — ACETAMINOPHEN 325 MG PO TABS
650.0000 mg | ORAL_TABLET | ORAL | Status: DC | PRN
Start: 2021-02-11 — End: 2021-02-12

## 2021-02-11 MED ORDER — SODIUM CHLORIDE 0.9 % IV SOLN: 5.0000 10*6.[IU] | Freq: Once | INTRAVENOUS | Status: DC

## 2021-02-11 MED ORDER — LACTATED RINGERS IV SOLN: 500.0000 mL | INTRAVENOUS | Status: DC | PRN

## 2021-02-11 MED ORDER — ONDANSETRON HCL 4 MG/2ML IJ SOLN
4.0000 mg | Freq: Four times a day (QID) | INTRAMUSCULAR | Status: DC | PRN
  Administered 2021-02-12: 4 mg via INTRAVENOUS
  Filled 2021-02-11: qty 2

## 2021-02-11 MED ORDER — OXYTOCIN-SODIUM CHLORIDE 30-0.9 UT/500ML-% IV SOLN
2.5000 [IU]/h | INTRAVENOUS | Status: DC
  Filled 2021-02-11: qty 500

## 2021-02-11 MED ORDER — SOD CITRATE-CITRIC ACID 500-334 MG/5ML PO SOLN
30.0000 mL | ORAL | Status: DC | PRN
Start: 2021-02-11 — End: 2021-02-12

## 2021-02-11 MED ORDER — LACTATED RINGERS IV SOLN: INTRAVENOUS | Status: DC

## 2021-02-11 MED ORDER — OXYTOCIN-SODIUM CHLORIDE 30-0.9 UT/500ML-% IV SOLN
1.0000 m[IU]/min | INTRAVENOUS | Status: DC
  Administered 2021-02-11: 2 m[IU]/min via INTRAVENOUS

## 2021-02-11 MED ORDER — LIDOCAINE HCL (PF) 1 % IJ SOLN
30.0000 mL | INTRAMUSCULAR | Status: DC | PRN
Start: 2021-02-11 — End: 2021-02-12

## 2021-02-11 MED ORDER — FENTANYL CITRATE (PF) 100 MCG/2ML IJ SOLN
50.0000 ug | INTRAMUSCULAR | Status: DC | PRN
  Administered 2021-02-11: 100 ug via INTRAVENOUS
  Filled 2021-02-11: qty 2

## 2021-02-11 MED ORDER — PENICILLIN G POT IN DEXTROSE 60000 UNIT/ML IV SOLN: 3.0000 10*6.[IU] | INTRAVENOUS | Status: DC

## 2021-02-11 MED ORDER — OXYTOCIN BOLUS FROM INFUSION
333.0000 mL | Freq: Once | INTRAVENOUS | Status: AC
  Administered 2021-02-12: 333 mL via INTRAVENOUS

## 2021-02-11 NOTE — Progress Notes (Addendum)
Labor Progress Note Chloe Gross is a 25 y.o. G1P0 at [redacted]w[redacted]d presented for SOL-w/ NRNST. S: Patient is standing in some discomfort from contractions.   Interpreter in room.   O:  BP 116/66   Pulse 80   Temp 99.2 F (37.3 C) (Oral)   Resp 18   LMP 03/31/2020   SpO2 100%  EFM: baseline 150 BPM/min-mod variability/+accels/-decels  Toco: contractions Q4-7 min  CVE: Dilation: 3 Effacement (%): 90 Station: 0, Plus 1 Presentation: Vertex Exam by:: Warrick Parisian, resident   A&P: 25 y.o. G1P0 [redacted]w[redacted]d presented for SOL-w/ NRNST. #Labor: Will start pitocin to augment the labor process due to lack of change. Next check in 5-6 hours to evaluate if we should AROM.  #Pain: Epidural #FWB: cat 1 overall #GBS negative #Birth Control: Pt has decided on depo IP. Have discussed that she needs depo every 3 months.    Alfredo Martinez, MD, PGY1 [facility] [employer], [facility] 10:32 PM

## 2021-02-11 NOTE — MAU Note (Signed)
Presents with ctxs that are 10 minutes apart, reports ctxs have increased in intensity since 0400.  Denies LOF or VB.  Endorses +FM.

## 2021-02-11 NOTE — H&P (Signed)
Chloe Gross is a 25 y.o. female, G1P0 at 40.4 weeks, presenting for SOL. She also had NRNST.  Patient receives care at [facility] and was supervised for a low-risk pregnancy. Pregnancy and medical history significant for problems as listed below. She is GBS Negative.  She is anticipating a girl infant and requests Nexplanon for PP birth control method.     Patient Active Problem List   Diagnosis Date Noted   Indication for care or intervention in labor or delivery 02/11/2021   Supervision of low-risk pregnancy 08/04/2020   Language barrier 08/04/2020   Late prenatal care affecting pregnancy, antepartum 08/04/2020    History of present pregnancy: Patient entered care at 19.3 weeks.   EDC of 02/07/2021 was established by [place] .   Anatomy scan:  15.3 weeks, with normal findings and an anterior placenta.   Additional [place] evaluations:   2/23: 19.1 EIF 3/24: 22wks EIF 5/16: 30wks EFW 27%, EIF not noted  Significant prenatal events: 1st Trimester: No Care  Last evaluation:  February 12, 2021 in office with NST    OB History     Gravida  1   Para      Term      Preterm      AB      Living         SAB      IAB      Ectopic      Multiple      Live Births             No past medical history on file. No past surgical history on file. Family History: family history is not on file. Social History:  reports that she has never smoked. She has never used smokeless tobacco. She reports previous alcohol use. She reports that she does not use drugs.   Prenatal Transfer Tool  Maternal Diabetes: No Genetic Screening: Normal Maternal Ultrasounds/Referrals: Normal Fetal Ultrasounds or other Referrals:  None Maternal Substance Abuse:  No Significant Maternal Medications:  None Significant Maternal Lab Results: Group B Strep negative    ROS:  -LOF, -VB, +Ctx  No Known Allergies   Dilation: 3 Effacement (%): 90 Station: 0, Plus 1 Blood pressure 119/66, pulse 79,  temperature 98.3 F (36.8 C), temperature source Oral, resp. rate 15, last menstrual period 03/31/2020, SpO2 100 %.  Physical Exam Vitals reviewed.  Constitutional:      Appearance: Normal appearance.  HENT:     Head: Normocephalic and atraumatic.  Eyes:     Conjunctiva/sclera: Conjunctivae normal.  Cardiovascular:     Rate and Rhythm: Normal rate and regular rhythm.  Abdominal:     General: Bowel sounds are normal.     Palpations: Abdomen is soft.     Tenderness: There is no abdominal tenderness.  Musculoskeletal:        General: Normal range of motion.     Right lower leg: No edema.     Left lower leg: No edema.  Skin:    General: Skin is warm and dry.  Neurological:     Mental Status: She is alert and oriented to person, place, and time.  Psychiatric:        Mood and Affect: Mood normal.        Behavior: Behavior normal.        Thought Content: Thought content normal.    [place]: EFW: 7lbs  Presentation: Vertex by Nurse   FHR: 140 bpm, Mod Var, +Variable/Late Decels, +Accels UCs:  Palpates mild to moderate  Prenatal labs: ABO, Rh: --/--/PENDING (07/20 1800) Antibody: PENDING (07/20 1800) Rubella:  Immune RPR: Non Reactive (01/10 1552)  HBsAg: Negative (01/10 1552)  HIV: Non Reactive (01/10 1552)  GBS:  Negative Sickle cell/Hgb electrophoresis:  Neg Pap:  Negative 08/14/2020 GC:  Negative Chlamydia:  Negative Other:      Assessment IUP at 40.4 weeks Cat I FT Latent Labor NRNST GBS Negative  Language Barrier  Plan: Admit to [employer]  Routine Labor and Delivery Orders per Protocol Will consider augmentation as appropriate.  Spanish speaking Report given to L&D Team  Joellyn Quails, MSN 02/11/2021, 7:01 PM

## 2021-02-12 ENCOUNTER — Encounter (HOSPITAL_COMMUNITY): Payer: Self-pay | Admitting: Obstetrics and Gynecology

## 2021-02-12 ENCOUNTER — Inpatient Hospital Stay (HOSPITAL_COMMUNITY): Payer: Medicaid Other | Admitting: Anesthesiology

## 2021-02-12 ENCOUNTER — Other Ambulatory Visit (HOSPITAL_COMMUNITY): Payer: Medicaid Other | Attending: Obstetrics and Gynecology

## 2021-02-12 ENCOUNTER — Inpatient Hospital Stay (HOSPITAL_COMMUNITY): Payer: Medicaid Other

## 2021-02-12 DIAGNOSIS — O480 Post-term pregnancy: Secondary | ICD-10-CM | POA: Diagnosis not present

## 2021-02-12 DIAGNOSIS — D649 Anemia, unspecified: Secondary | ICD-10-CM | POA: Diagnosis not present

## 2021-02-12 DIAGNOSIS — O9902 Anemia complicating childbirth: Secondary | ICD-10-CM | POA: Diagnosis not present

## 2021-02-12 DIAGNOSIS — Z3A40 40 weeks gestation of pregnancy: Secondary | ICD-10-CM | POA: Diagnosis not present

## 2021-02-12 LAB — RPR: RPR Ser Ql: NONREACTIVE

## 2021-02-12 MED ORDER — WITCH HAZEL-GLYCERIN EX PADS: 1.0000 "application " | MEDICATED_PAD | CUTANEOUS | Status: DC | PRN

## 2021-02-12 MED ORDER — DIPHENHYDRAMINE HCL 25 MG PO CAPS: 25.0000 mg | ORAL_CAPSULE | Freq: Four times a day (QID) | ORAL | Status: DC | PRN

## 2021-02-12 MED ORDER — ONDANSETRON HCL 4 MG/2ML IJ SOLN: 4.0000 mg | INTRAMUSCULAR | Status: DC | PRN

## 2021-02-12 MED ORDER — SENNOSIDES-DOCUSATE SODIUM 8.6-50 MG PO TABS
2.0000 | ORAL_TABLET | ORAL | Status: DC
  Administered 2021-02-12 – 2021-02-13 (×2): 2 via ORAL
  Filled 2021-02-12 (×2): qty 2

## 2021-02-12 MED ORDER — ACETAMINOPHEN 325 MG PO TABS
650.0000 mg | ORAL_TABLET | ORAL | Status: DC | PRN
  Administered 2021-02-12 – 2021-02-13 (×3): 650 mg via ORAL
  Filled 2021-02-12 (×3): qty 2

## 2021-02-12 MED ORDER — COCONUT OIL OIL: 1.0000 "application " | TOPICAL_OIL | Status: DC | PRN

## 2021-02-12 MED ORDER — EPHEDRINE 5 MG/ML INJ
10.0000 mg | INTRAVENOUS | Status: DC | PRN
  Filled 2021-02-12: qty 5

## 2021-02-12 MED ORDER — EPHEDRINE 5 MG/ML INJ
10.0000 mg | INTRAVENOUS | Status: DC | PRN
  Administered 2021-02-12: 10 mg via INTRAVENOUS

## 2021-02-12 MED ORDER — LIDOCAINE HCL (PF) 1 % IJ SOLN
INTRAMUSCULAR | Status: DC | PRN
  Administered 2021-02-12: 3 mL via EPIDURAL
  Administered 2021-02-12: 4 mL via EPIDURAL

## 2021-02-12 MED ORDER — PHENYLEPHRINE 40 MCG/ML (10ML) SYRINGE FOR IV PUSH (FOR BLOOD PRESSURE SUPPORT)
80.0000 ug | PREFILLED_SYRINGE | INTRAVENOUS | Status: AC | PRN
  Administered 2021-02-12 (×3): 80 ug via INTRAVENOUS

## 2021-02-12 MED ORDER — SIMETHICONE 80 MG PO CHEW: 80.0000 mg | CHEWABLE_TABLET | ORAL | Status: DC | PRN

## 2021-02-12 MED ORDER — IBUPROFEN 600 MG PO TABS
600.0000 mg | ORAL_TABLET | Freq: Four times a day (QID) | ORAL | Status: DC
  Administered 2021-02-12 – 2021-02-14 (×5): 600 mg via ORAL
  Filled 2021-02-12 (×6): qty 1

## 2021-02-12 MED ORDER — ONDANSETRON HCL 4 MG PO TABS: 4.0000 mg | ORAL_TABLET | ORAL | Status: DC | PRN

## 2021-02-12 MED ORDER — ZOLPIDEM TARTRATE 5 MG PO TABS: 5.0000 mg | ORAL_TABLET | Freq: Every evening | ORAL | Status: DC | PRN

## 2021-02-12 MED ORDER — PHENYLEPHRINE 40 MCG/ML (10ML) SYRINGE FOR IV PUSH (FOR BLOOD PRESSURE SUPPORT)
80.0000 ug | PREFILLED_SYRINGE | INTRAVENOUS | Status: DC | PRN
  Filled 2021-02-12: qty 10

## 2021-02-12 MED ORDER — FENTANYL-BUPIVACAINE-NACL 0.5-0.125-0.9 MG/250ML-% EP SOLN
12.0000 mL/h | EPIDURAL | Status: DC | PRN
  Administered 2021-02-12: 10.5 mL/h via EPIDURAL
  Filled 2021-02-12: qty 250

## 2021-02-12 MED ORDER — BENZOCAINE-MENTHOL 20-0.5 % EX AERO: 1.0000 "application " | INHALATION_SPRAY | CUTANEOUS | Status: DC | PRN

## 2021-02-12 MED ORDER — TETANUS-DIPHTH-ACELL PERTUSSIS 5-2.5-18.5 LF-MCG/0.5 IM SUSY: 0.5000 mL | PREFILLED_SYRINGE | Freq: Once | INTRAMUSCULAR | Status: DC

## 2021-02-12 MED ORDER — LACTATED RINGERS IV SOLN: 500.0000 mL | Freq: Once | INTRAVENOUS | Status: DC

## 2021-02-12 MED ORDER — DIBUCAINE (PERIANAL) 1 % EX OINT: 1.0000 "application " | TOPICAL_OINTMENT | CUTANEOUS | Status: DC | PRN

## 2021-02-12 MED ORDER — PRENATAL MULTIVITAMIN CH
1.0000 | ORAL_TABLET | Freq: Every day | ORAL | Status: DC
Start: 2021-02-12 — End: 2021-02-14
  Administered 2021-02-12 – 2021-02-14 (×3): 1 via ORAL
  Filled 2021-02-12 (×3): qty 1

## 2021-02-12 MED ORDER — DIPHENHYDRAMINE HCL 50 MG/ML IJ SOLN: 12.5000 mg | INTRAMUSCULAR | Status: DC | PRN

## 2021-02-12 NOTE — Anesthesia Procedure Notes (Signed)
Epidural Patient location during procedure: OB Start time: 02/12/2021 12:59 AM End time: 02/12/2021 1:07 AM  Staffing Anesthesiologist: Mal Amabile, MD Performed: anesthesiologist   Preanesthetic Checklist Completed: patient identified, IV checked, site marked, risks and benefits discussed, surgical consent, monitors and equipment checked, pre-op evaluation and timeout performed  Epidural Patient position: sitting Prep: DuraPrep and site prepped and draped Patient monitoring: continuous pulse ox and blood pressure Approach: midline Location: L3-L4 Injection technique: LOR air  Needle:  Needle type: Tuohy  Needle gauge: 17 G Needle length: 9 cm and 9 Needle insertion depth: 4 cm Catheter type: closed end flexible Catheter size: 19 Gauge Catheter at skin depth: 9 cm Test dose: negative and Other  Assessment Events: blood not aspirated, injection not painful, no injection resistance, no paresthesia and negative IV test  Additional Notes Patient identified. Risks and benefits discussed including failed block, incomplete  Pain control, post dural puncture headache, nerve damage, paralysis, blood pressure Changes, nausea, vomiting, reactions to medications-both toxic and allergic and post Partum back pain. All questions were answered. Patient expressed understanding and wished to proceed. Sterile technique was used throughout procedure. Epidural site was Dressed with sterile barrier dressing. No paresthesias, signs of intravascular injection Or signs of intrathecal spread were encountered.  Patient was more comfortable after the epidural was dosed. Please see RN's note for documentation of vital signs and FHR which are stable. Reason for block:procedure for pain

## 2021-02-12 NOTE — Progress Notes (Addendum)
Labor Progress Note Chloe Gross is a 25 y.o. G1P0 at [redacted]w[redacted]d presented for SOL-NRNST.   S: Patient is resting comfortably. She received her epidural and does not feel her contractions.   Interpreter present.   O:  BP (!) 107/57   Pulse 64   Temp 98.2 F (36.8 C) (Oral)   Resp 20   LMP 03/31/2020   SpO2 100%  EFM: baseline 150 BPM/min-mod variability/prolonged decel/-accels Toco: contractions q1-4 min  CVE: Dilation: 3 Effacement (%): 90 Station: 0, Plus 1 Presentation: Vertex Exam by:: Warrick Parisian, resident   A&P: 25 y.o. G1P0 [redacted]w[redacted]d presenting for SOL-NRNST. #SOL: Pit started @2232  and running @8ml /hr. Upon next recheck will evaluate the ability to AROM.  #Pain: Epidural  #FWB: Prolonged decel that corrected. IV fluid bolus given in addition to phenylephrine to assist with pt's HR. Current HR: 60s. Continue to monitor strip.  #GBS negative   , MD Center for , [facility] 2:13 AM

## 2021-02-12 NOTE — Anesthesia Preprocedure Evaluation (Signed)
Anesthesia Evaluation  Patient identified by MRN, date of birth, ID band Patient awake    Airway Mallampati: II  TM Distance: >3 FB Neck ROM: Full    Dental no notable dental hx. (+) Teeth Intact, Dental Advisory Given   Pulmonary neg pulmonary ROS,  Covid + on 01/30/21- symptomatic but improved now   Pulmonary exam normal breath sounds clear to auscultation       Cardiovascular negative cardio ROS Normal cardiovascular exam Rhythm:Regular Rate:Normal     Neuro/Psych negative neurological ROS  negative psych ROS   GI/Hepatic Neg liver ROS, GERD  ,  Endo/Other  negative endocrine ROS  Renal/GU negative Renal ROS  negative genitourinary   Musculoskeletal negative musculoskeletal ROS (+)   Abdominal   Peds  Hematology  (+) anemia ,   Anesthesia Other Findings   Reproductive/Obstetrics (+) Pregnancy                             Anesthesia Physical Anesthesia Plan  ASA: 2  Anesthesia Plan:    Post-op Pain Management:    Induction:   PONV Risk Score and Plan:   Airway Management Planned: Natural Airway  Additional Equipment:   Intra-op Plan:   Post-operative Plan:   Informed Consent: I have reviewed the patients History and Physical, chart, labs and discussed the procedure including the risks, benefits and alternatives for the proposed anesthesia with the patient or authorized representative who has indicated his/her understanding and acceptance.       Plan Discussed with: Anesthesiologist  Anesthesia Plan Comments:         Anesthesia Quick Evaluation

## 2021-02-12 NOTE — Lactation Note (Signed)
Lactation Consultation Note  Patient Name: Chloe Gross Date: 02/12/2021 Age:25 y.o.  [facility] contacted Elmore Guise, RN to check desire for [facility] visit. RN states mother already breastfed for 45 minutes. LC will visit dyad at [facility].   Shanessa Hodak A Higuera Ancidey 02/12/2021, 11:17 AM

## 2021-02-12 NOTE — Progress Notes (Signed)
Labor Progress Note Chloe Gross is a 25 y.o. G1P0 at [redacted]w[redacted]d presented for SOL-NRNST. S: Patient is resting comfortably.  O:  BP 123/65   Pulse 74   Temp 98.9 F (37.2 C) (Oral)   Resp 20   LMP 03/31/2020   SpO2 100%  EFM: baseline 130 BPM/min variability/-accels/-decels Toco: contractions q1-46min  CVE: Dilation: 5 Effacement (%): 90 Station: 0 Presentation: Vertex Exam by:: Germaine Pomfret, MD   A&P: 25 y.o. G1P0 [redacted]w[redacted]d SOL-NRNST. #Labor: Progressing well. Pt can start pushing. #Pain: epidural  #FWB: cat 1 overall #GBS negative  Alfredo Martinez, MD Center for [employer], [facility] 6:33 AM

## 2021-02-12 NOTE — Progress Notes (Signed)
During shift report, dayshift RN informed RN that patients foley was not removed before bringing patient to [facility] from labor and delivery. RN stated that patient had only had a small trickle of urine out on her own and could not urinate anymore. Dayshift RN stated that he bladder scanned her and it showed more than in patients bladder. Patient was asked during bedside shift report if she was able to void anymore and she stated very little. Patient was straight catheterized by Pilar Jarvis, RN. Patients urine output was 1300. A hat was placed in the toilet and explained to patient its use. Patient demonstrated understanding. Patients urine output will be closely monitored throughout shift.

## 2021-02-12 NOTE — Discharge Summary (Signed)
Postpartum Discharge Summary   Patient Name: Chloe Gross DOB: 1996-03-02 MRN: [id number]  Date of admission: 02/11/2021 Delivery date:02/12/2021  Delivering provider: Gavin Pound  Date of discharge: 02/14/2021  Admitting diagnosis: Indication for care or intervention in labor or delivery [O75.9] Intrauterine pregnancy: [redacted]w[redacted]d     Secondary diagnosis:  Active Problems:   Indication for care or intervention in labor or delivery   Vaginal delivery   Obstetrical laceration, second degree  Additional problems: n/a    Discharge diagnosis: Term Pregnancy Delivered                                              Post partum procedures: n/a Augmentation: AROM and Pitocin Complications: None  Hospital course: Onset of Labor With Vaginal Delivery      25 y.o. yo G1P1001 at [redacted]w[redacted]d was admitted on 02/11/2021. She presented in latent labor at 2.5cm and had a NRNST.  She received pitocin and AROM for augmentation and progressed to complete without incident.  She delivered female infant and had a 2nd degree vaginal laceration that was repaired.    Patient had an uncomplicated labor course as follows:  Membrane Rupture Time/Date: 2:36 AM ,02/12/2021   Delivery Method:Vaginal, Spontaneous  Episiotomy: None  Lacerations:  2nd degree;Vaginal  Patient had an uncomplicated postpartum course.  She is ambulating, tolerating a regular diet, passing flatus, and urinating well. Patient is discharged home in stable condition on 02/14/21.  Newborn Data: Birth date:02/12/2021  Birth time:9:54 AM  Gender:Female -Jarrett Soho Living status:Living  Apgars:9 ,9  Weight:3220 g   Magnesium Sulfate received: No BMZ received: No Rhophylac:No MMR:No T-DaP:Given prenatally Flu: No Transfusion:No  Physical exam  Vitals:   02/13/21 0341 02/13/21 0625 02/13/21 2241 02/14/21 0651  BP: (!) 92/52 100/73 (!) 99/53 101/64  Pulse: 74 76 74 79  Resp: $Remo'18  17 17  'CbEGC$ Temp: 97.8 F (36.6 C) 98.7 F (37.1 C) 97.9  F (36.6 C) 98.6 F (37 C)  TempSrc: Oral Oral Oral Oral  SpO2: 100% 100% 100% 100%  Weight:      Height:       General: alert, cooperative, and no distress Lochia: appropriate Uterine Fundus: firm Incision: N/A DVT Evaluation: No evidence of DVT seen on physical exam. Labs: Lab Results  Component Value Date   WBC 13.1 (H) 02/11/2021   HGB 13.0 02/11/2021   HCT 37.5 02/11/2021   MCV 87.8 02/11/2021   PLT 369 02/11/2021   CMP Latest Ref Rng & Units 01/30/2021  Glucose 70 - 99 mg/dL 104(H)  BUN 6 - 20 mg/dL <5(L)  Creatinine 0.44 - 1.00 mg/dL 0.48  Sodium 135 - 145 mmol/L 132(L)  Potassium 3.5 - 5.1 mmol/L 3.8  Chloride 98 - 111 mmol/L 103  CO2 22 - 32 mmol/L 21(L)  Calcium 8.9 - 10.3 mg/dL 8.8(L)   Flavia Shipper Score: Edinburgh Postnatal Depression Scale Screening Tool 02/12/2021  I have been able to laugh and see the funny side of things. 0  I have looked forward with enjoyment to things. 0  I have blamed myself unnecessarily when things went wrong. 0  I have been anxious or worried for no good reason. 1  I have felt scared or panicky for no good reason. 0  Things have been getting on top of me. 1  I have been so unhappy that I have had difficulty  sleeping. 0  I have felt sad or miserable. 0  I have been so unhappy that I have been crying. 0  The thought of harming myself has occurred to me. 0  Edinburgh Postnatal Depression Scale Total 2     After visit meds:  Allergies as of 02/14/2021   No Known Allergies      Medication List     TAKE these medications    ibuprofen 600 MG tablet Commonly known as: ADVIL Take 1 tablet (600 mg total) by mouth every 6 (six) hours.   PRENATAL VITAMINS PO Take 1 tablet by mouth daily.        Discharge home in stable condition Infant Feeding: Breast Infant Disposition:home with mother Discharge instruction: per After Visit Summary and Postpartum booklet. Activity: Advance as tolerated. Pelvic rest for 6 weeks.  Diet:  routine diet Future Appointments:No future appointments. Follow up Visit:  Follow-up Information     Department, [facility] Follow up.   Why: In 4 weeks for a postpartum appt Contact information: [address] [place] [address] [phone]                To be scheduled with [facility] by patient     02/14/2021 Wende Mott, CNM

## 2021-02-12 NOTE — Progress Notes (Signed)
Labor Progress Note Chloe Gross is a 25 y.o. G1P0 at [redacted]w[redacted]d presented for SOL-NRNST.    S: Patient is resting comfortably, denies any complaints at this time.  Interpreter present   O:  BP 124/72   Pulse 69   Temp 98.2 F (36.8 C) (Oral)   Resp 20   LMP 03/31/2020   SpO2 100%  EFM: baseline 150 BPM/min variability/-accels/-decels  Toco: contractions q1-3 min CVE: Dilation: 5 Effacement (%): 90 Station: 0 Presentation: Vertex Exam by:: Germaine Pomfret, MD   A&P: 25 y.o. G1P0 [redacted]w[redacted]d SOL-NRNST.    #SOL: Pit started @2232  and running @8ml /hr. AROM@0236 -clear fluid to assist in progression of labor. Continue to titrate as tolerated. Will recheck if pt starts feeling changes.  #Pain: Epidural #FWB: cat 2 with min variability. S/P LR bolus x2, phenylephrine and ephedrine. Continue with position changes and peanut ball.  #GBS negative   , MD, PGY1 Center for , [facility] 3:21 AM

## 2021-02-12 NOTE — Lactation Note (Signed)
This note was copied from a baby's chart. Lactation Consultation Note  Patient Name: Chloe Gross Today's Date: 02/12/2021 Reason for consult: Initial assessment;Term;1st time breastfeeding;Primapara Age:23 hours Consult was done in Spanish:  Initial visit to 5 hours old infant of a P1 mother. Mother states baby latch after delivery. Mother prefers breast and formula. Infant already had ~10 mL of formula. LC talked about appropriate volumes, baby's stomach, pace-bottlefeeding, upright position and frequent burping. LC demonstrated hand expression.    Plan: 1-Skin to skin 2-Aim for a deep, comfortable latch 3-Breastfeeding on demand or 8-12 times in 24h period. 4-Keep infant awake during breastfeeding session: massaging breast, infant's hand/shoulder/feet 5-Supplement as needed following guidelines.  6-Monitor voids and stools as signs good intake.  7-Encouraged maternal rest, hydration and food intake.  8-Contact LC as needed for feeds/support/concerns/questions   All questions answered at this time. Provided Lactation services brochure and promoted [employer] booklet information.    Maternal Data Has patient been taught Hand Expression?: Yes Does the patient have breastfeeding experience prior to this delivery?: No  Feeding Mother's Current Feeding Choice: Breast Milk and Formula  Interventions Interventions: Breast feeding basics reviewed;Skin to skin;Expressed milk;Education  Discharge Pump: Personal WIC Program: Yes  Consult Status Consult Status: Follow-up Date: 02/13/21 Follow-up type: In-patient    Shamarr Faucett A Higuera Ancidey 02/12/2021, 2:55 PM

## 2021-02-13 DIAGNOSIS — Z3A40 40 weeks gestation of pregnancy: Secondary | ICD-10-CM | POA: Diagnosis not present

## 2021-02-13 DIAGNOSIS — O480 Post-term pregnancy: Secondary | ICD-10-CM | POA: Diagnosis not present

## 2021-02-13 NOTE — Progress Notes (Signed)
Post Partum Day 1 Subjective: no complaints, up ad lib, voiding, tolerating PO, and + flatus  Objective: Blood pressure 100/73, pulse 76, temperature 98.7 F (37.1 C), temperature source Oral, resp. rate 18, height 4' 9.5" (1.461 m), weight 65 kg, last menstrual period 03/31/2020, SpO2 100 %, unknown if currently breastfeeding.  Physical Exam:  General: alert, cooperative, and no distress Lochia: appropriate Uterine Fundus: firm Incision: n/a DVT Evaluation: No evidence of DVT seen on physical exam.  Recent Labs    02/11/21 1820  HGB 13.0  HCT 37.5    Assessment/Plan: Plan for discharge tomorrow. Patient doing well without complaint. Planning depo from contraception   LOS: 2 days   Rolm Bookbinder CNM 02/13/2021, 9:28 AM

## 2021-02-13 NOTE — Lactation Note (Signed)
This note was copied from a baby's chart. Lactation Consultation Note  Patient Name: Chloe Gross VZCHY'[id number] Date: 02/13/2021 Reason for consult: Mother's request;Follow-up assessment;Difficult latch;Primapara;1st time breastfeeding;Term Age:23 hours LC talked to mother with aid of spanish translator Jesus [phone] On LC arrival, Infant fed 15 ml of formula 2 hrs prior to [facility] visit. Mom infant dressed in an outfit. LC asked about latching at breast, Mom stated tried once in the morning and again at 3:30 pm but not able to sustain the latch. Mom short shafted small nipples. LC talked with mom offering formula and not putting infant to breast can affect her milk supply.  LC set Mom up on DEBP pumping ever 3 hrs for 15 min after latching at the breast.   Mom to call for latch assistance of LC or RN before next feeding.   Plan 1. To fee based onn cues 8-12x in 24 hr period no more than 4 hrs without an attempt. Mom to offer both breasts and look for signs of milk transfer.  2. Mom to supplement based on breastfeeding supplementation guide provided. Mom aware if infant not latching at breast can offer more 15-30 ml 3. Mom to supplement with EBM first followed by formula 4. Mom to pump with DEBP q 3hrs for 15 min 5 I and O sheet reviewed.  6. LC brochure of inpatient and outpatient services reviewed.  All questions answered at the end of the visit.  Mom sized with 24 flanges stated comfortable fit.  Mom aware formula good for 1 hr.  EBM good 4 hrs room temp.    Maternal Data Has patient been taught Hand Expression?: Yes Does the patient have breastfeeding experience prior to this delivery?: No  Feeding Mother's Current Feeding Choice: Breast Milk and Formula Nipple Type: Slow - flow  LATCH Score                    Lactation Tools Discussed/Used Tools: Pump;Flanges Flange Size: 24 Breast pump type: Double-Electric Breast Pump Pump Education: Setup, frequency, and  cleaning;Milk Storage Reason for Pumping: increase stimulation Pumping frequency: every 3 hrs for 15 min  Interventions Interventions: Breast feeding basics reviewed;Education;Position options;Skin to skin;Expressed milk;Breast massage;Hand express;Pre-pump if needed;DEBP  Discharge Pump: Manual WIC Program: Yes  Consult Status Consult Status: Follow-up Date: 02/14/21 Follow-up type: In-patient    Chloe Gross  Nicholson-Springer 02/13/2021, 9:17 PM

## 2021-02-13 NOTE — Anesthesia Postprocedure Evaluation (Signed)
Anesthesia Post Note  Patient: Chloe Gross  Procedure(s) Performed: AN AD HOC LABOR EPIDURAL     Patient location during evaluation: Mother Baby Anesthesia Type: Epidural Level of consciousness: awake and alert Pain management: pain level controlled Vital Signs Assessment: post-procedure vital signs reviewed and stable Respiratory status: spontaneous breathing, nonlabored ventilation and respiratory function stable Cardiovascular status: stable Postop Assessment: no headache, no backache, epidural receding, no apparent nausea or vomiting, patient able to bend at knees, adequate PO intake and able to ambulate Anesthetic complications: no   No notable events documented.  Last Vitals:  Vitals:   02/13/21 0341 02/13/21 0625  BP: (!) 92/52 100/73  Pulse: 74 76  Resp: 18   Temp: 36.6 C 37.1 C  SpO2: 100% 100%    Last Pain:  Vitals:   02/13/21 0850  TempSrc:   PainSc: 3    Pain Goal:                   Laban Emperor

## 2021-02-14 ENCOUNTER — Inpatient Hospital Stay (HOSPITAL_COMMUNITY): Payer: Medicaid Other

## 2021-02-14 ENCOUNTER — Inpatient Hospital Stay (HOSPITAL_COMMUNITY): Admission: AD | Admit: 2021-02-14 | Payer: Medicaid Other | Source: Home / Self Care | Admitting: Family Medicine

## 2021-02-14 DIAGNOSIS — Z3A40 40 weeks gestation of pregnancy: Secondary | ICD-10-CM | POA: Diagnosis not present

## 2021-02-14 DIAGNOSIS — O480 Post-term pregnancy: Secondary | ICD-10-CM | POA: Diagnosis not present

## 2021-02-14 MED ORDER — IBUPROFEN 600 MG PO TABS: 600.0000 mg | ORAL_TABLET | Freq: Four times a day (QID) | ORAL | 0 refills | Status: AC

## 2021-02-14 MED ORDER — MEDROXYPROGESTERONE ACETATE 150 MG/ML IM SUSP
150.0000 mg | Freq: Once | INTRAMUSCULAR | Status: AC
  Administered 2021-02-14: 150 mg via INTRAMUSCULAR
  Filled 2021-02-14: qty 1

## 2021-02-14 NOTE — Lactation Note (Addendum)
This note was copied from a baby's chart. Lactation Consultation Note  Patient Name: Chloe Gross ALPFX'T Date: 02/14/2021 Reason for consult: Follow-up assessment;Term;Primapara;1st time breastfeeding Age:23 hours Consult was done in Spanish:  LC in to room prior to discharge. Mother reports some breast fullness and firmness. LC demonstrated manual pump use, collected ~5 mL. Talked about pre-pumping for nipple eversion prior to latch. Encouraged to use DEBP to empty breasts, and collected ~32mL of EBM. Several attempts to latch but infant only able to latch 20 mm nipple shield. LC pointed out nipple shield is a temporary training tool, and discouraged long term use. Reinforced neck/back support and extension. Mother denies pain or discomfort. Discussed normal infant behavior, clusterfeeding, normal output. Talked about milk coming into volume. Urged mother to pump for at least 15 minutes after feedings when using nipple shield.   Feeding plan:  1-Skin to skin 2-Aim for a deep, comfortable latch 3-Breastfeeding on demand or 8-12 times in 24h period. 4-Keep infant awake during breastfeeding session: massaging breast, infant's hand/shoulder/feet 5-Pump or hand-express after feedings when using nipple shield. 6-If supplementing with formula, follow guidelines, pace bottlefeed, uptight position and frequent burping. 7-Monitor voids and stools as signs good intake.  8-Encouraged maternal rest, hydration and food intake.  9-Contact LC as needed for feeds/support/concerns/questions    All questions answered at this time. Reviewed LC brochure.   Maternal Data Has patient been taught Hand Expression?: Yes Does the patient have breastfeeding experience prior to this delivery?: No  Feeding Mother's Current Feeding Choice: Breast Milk and Formula  LATCH Score Latch: Grasps breast easily, tongue down, lips flanged, rhythmical sucking. (uncoordinated suckles first, later transitions  to rhythmic suckling)  Audible Swallowing: Spontaneous and intermittent  Type of Nipple: Everted at rest and after stimulation (short shafted)  Comfort (Breast/Nipple): Soft / non-tender  Hold (Positioning): Assistance needed to correctly position infant at breast and maintain latch.  LATCH Score: 9   Lactation Tools Discussed/Used Tools: Pump;Flanges;Nipple Shields Nipple shield size: 20 Flange Size: 24 Breast pump type: Double-Electric Breast Pump;Manual Pump Education: Milk Storage;Setup, frequency, and cleaning Reason for Pumping: NS use Pumping frequency: prepump for nipple eversion and after feedings if supplementation is used. Pumped volume: 10 mL  Interventions Interventions: Breast feeding basics reviewed;Assisted with latch;Skin to skin;Breast massage;Hand express;Pre-pump if needed;Breast compression;Adjust position;Support pillows;Position options;Expressed milk;Hand pump;DEBP;Education  Discharge Pump: Manual;Personal WIC Program: Yes  Consult Status Consult Status: Complete Date: 02/14/21 Follow-up type: Call as needed    Chloe Gross 02/14/2021, 1:36 PM

## 2021-02-16 ENCOUNTER — Telehealth: Payer: Self-pay

## 2021-02-16 NOTE — Telephone Encounter (Signed)
Transition Care Management Follow-up Telephone Call Date of discharge and from where: 02/14/2021-[facility]  How have you been since you were released from the hospital? Patient stated she is doing fine. Any questions or concerns? No  Items Reviewed: Did the pt receive and understand the discharge instructions provided? Yes  Medications obtained and verified? Yes  Other? No  Any new allergies since your discharge? No  Dietary orders reviewed? N/a Do you have support at home? Yes   Home Care and Equipment/Supplies: Were home health services ordered? not applicable If so, what is the name of the agency? N/A  Has the agency set up a time to come to the patient's home? not applicable Were any new equipment or medical supplies ordered?  No What is the name of the medical supply agency? N/A Were you able to get the supplies/equipment? not applicable Do you have any questions related to the use of the equipment or supplies? No  Functional Questionnaire: (I = Independent and D = Dependent) ADLs: I  Bathing/Dressing- I  Meal Prep- I  Eating- I  Maintaining continence- I  Transferring/Ambulation- I  Managing Meds- I  Follow up appointments reviewed:  PCP Hospital f/u appt confirmed? No   Specialist Hospital f/u appt confirmed? No   Are transportation arrangements needed? No  If their condition worsens, is the pt aware to call PCP or go to the Emergency Dept.? Yes Was the patient provided with contact information for the PCP's office or ED? Yes Was to pt encouraged to call back with questions or concerns? Yes

## 2021-02-24 ENCOUNTER — Telehealth (HOSPITAL_COMMUNITY): Payer: Self-pay | Admitting: *Deleted

## 2021-02-24 NOTE — Telephone Encounter (Signed)
Hospital Discharge Follow-Up Call:  Patient reports that she is doing well and has no concerns about her healing process.  Her EPDS Score today was 0 and she says this accurately reflects how she is doing emotionally.  Patient reports that her baby is doing well. She says baby sleeps in a bassinet.

## 2021-03-04 ENCOUNTER — Encounter (HOSPITAL_COMMUNITY): Payer: Self-pay | Admitting: *Deleted

## 2021-03-04 ENCOUNTER — Other Ambulatory Visit: Payer: Self-pay

## 2021-03-04 ENCOUNTER — Emergency Department (HOSPITAL_COMMUNITY)
Admission: EM | Admit: 2021-03-04 | Discharge: 2021-03-04 | Disposition: A | Discharge status: Home / Self Care | Payer: Medicaid Other | Attending: Emergency Medicine | Admitting: Emergency Medicine

## 2021-03-04 DIAGNOSIS — R000 Tachycardia, unspecified: Secondary | ICD-10-CM | POA: Diagnosis not present

## 2021-03-04 DIAGNOSIS — N390 Urinary tract infection, site not specified: Secondary | ICD-10-CM | POA: Diagnosis not present

## 2021-03-04 DIAGNOSIS — E876 Hypokalemia: Secondary | ICD-10-CM | POA: Diagnosis not present

## 2021-03-04 DIAGNOSIS — R509 Fever, unspecified: Secondary | ICD-10-CM | POA: Diagnosis present

## 2021-03-04 LAB — CBC WITH DIFFERENTIAL/PLATELET
Abs Immature Granulocytes: 0 10*3/uL (ref 0.00–0.07)
Basophils Absolute: 0 10*3/uL (ref 0.0–0.1)
Basophils Relative: 0 %
Eosinophils Absolute: 0 10*3/uL (ref 0.0–0.5)
Eosinophils Relative: 0 %
HCT: 32.4 % — ABNORMAL LOW (ref 36.0–46.0)
Hemoglobin: 10.8 g/dL — ABNORMAL LOW (ref 12.0–15.0)
Lymphocytes Relative: 9 %
Lymphs Abs: 1.7 10*3/uL (ref 0.7–4.0)
MCH: 29 pg (ref 26.0–34.0)
MCHC: 33.3 g/dL (ref 30.0–36.0)
MCV: 87.1 fL (ref 80.0–100.0)
Monocytes Absolute: 0.8 10*3/uL (ref 0.1–1.0)
Monocytes Relative: 4 %
Neutro Abs: 16.6 10*3/uL — ABNORMAL HIGH (ref 1.7–7.7)
Neutrophils Relative %: 87 %
Platelets: 349 10*3/uL (ref 150–400)
RBC: 3.72 MIL/uL — ABNORMAL LOW (ref 3.87–5.11)
RDW: 14.1 % (ref 11.5–15.5)
WBC: 19.1 10*3/uL — ABNORMAL HIGH (ref 4.0–10.5)
nRBC: 0 % (ref 0.0–0.2)
nRBC: 0 /100 WBC

## 2021-03-04 LAB — COMPREHENSIVE METABOLIC PANEL
ALT: 17 U/L (ref 0–44)
AST: 21 U/L (ref 15–41)
Albumin: 2.8 g/dL — ABNORMAL LOW (ref 3.5–5.0)
Alkaline Phosphatase: 127 U/L — ABNORMAL HIGH (ref 38–126)
Anion gap: 12 (ref 5–15)
BUN: <5 mg/dL — ABNORMAL LOW (ref 6–20)
CO2: 19 mmol/L — ABNORMAL LOW (ref 22–32)
Calcium: 8.5 mg/dL — ABNORMAL LOW (ref 8.9–10.3)
Chloride: 105 mmol/L (ref 98–111)
Creatinine, Ser: 0.75 mg/dL (ref 0.44–1.00)
GFR, Estimated: >60 mL/min (ref >60)
Glucose, Bld: 145 mg/dL — ABNORMAL HIGH (ref 70–99)
Potassium: 2.7 mmol/L — CL (ref 3.5–5.1)
Sodium: 136 mmol/L (ref 135–145)
Total Bilirubin: 0.5 mg/dL (ref 0.3–1.2)
Total Protein: 6.9 g/dL (ref 6.5–8.1)

## 2021-03-04 LAB — URINALYSIS, ROUTINE W REFLEX MICROSCOPIC
Bilirubin Urine: NEGATIVE
Glucose, UA: NEGATIVE mg/dL
Ketones, ur: NEGATIVE mg/dL
Nitrite: NEGATIVE
Protein, ur: 30 mg/dL — AB
Specific Gravity, Urine: 1.003 — ABNORMAL LOW (ref 1.005–1.030)
pH: 7 (ref 5.0–8.0)

## 2021-03-04 LAB — LACTIC ACID, PLASMA
Lactic Acid, Venous: 2.6 mmol/L (ref 0.5–1.9)
Lactic Acid, Venous: 2 mmol/L (ref 0.5–1.9)

## 2021-03-04 MED ORDER — SODIUM CHLORIDE 0.9 % IV SOLN: Freq: Once | INTRAVENOUS | Status: DC

## 2021-03-04 MED ORDER — CEPHALEXIN 500 MG PO CAPS: 500.0000 mg | ORAL_CAPSULE | Freq: Two times a day (BID) | ORAL | 0 refills | Status: AC

## 2021-03-04 MED ORDER — LACTATED RINGERS IV SOLN: INTRAVENOUS | Status: DC

## 2021-03-04 MED ORDER — SODIUM CHLORIDE 0.9 % IV BOLUS: 1000.0000 mL | Freq: Once | INTRAVENOUS | Status: DC

## 2021-03-04 MED ORDER — POTASSIUM CHLORIDE CRYS ER 20 MEQ PO TBCR: 20.0000 meq | EXTENDED_RELEASE_TABLET | Freq: Two times a day (BID) | ORAL | 0 refills | Status: AC

## 2021-03-04 MED ORDER — SODIUM CHLORIDE 0.9 % IV SOLN
1.0000 g | Freq: Once | INTRAVENOUS | Status: AC
  Administered 2021-03-04: 1 g via INTRAVENOUS
  Filled 2021-03-04: qty 10

## 2021-03-04 MED ORDER — POTASSIUM CHLORIDE CRYS ER 20 MEQ PO TBCR
40.0000 meq | EXTENDED_RELEASE_TABLET | Freq: Once | ORAL | Status: AC
  Administered 2021-03-04: 40 meq via ORAL
  Filled 2021-03-04: qty 2

## 2021-03-04 MED ORDER — LACTATED RINGERS IV BOLUS (SEPSIS)
1000.0000 mL | Freq: Once | INTRAVENOUS | Status: AC
  Administered 2021-03-04: 1000 mL via INTRAVENOUS

## 2021-03-04 MED ORDER — IBUPROFEN 400 MG PO TABS
600.0000 mg | ORAL_TABLET | Freq: Once | ORAL | Status: AC
  Administered 2021-03-04: 600 mg via ORAL
  Filled 2021-03-04: qty 1

## 2021-03-04 MED ORDER — ACETAMINOPHEN 325 MG PO TABS
650.0000 mg | ORAL_TABLET | Freq: Once | ORAL | Status: AC
  Administered 2021-03-04: 650 mg via ORAL
  Filled 2021-03-04: qty 2

## 2021-03-04 MED ORDER — POTASSIUM CHLORIDE 10 MEQ/100ML IV SOLN
10.0000 meq | INTRAVENOUS | Status: AC
  Administered 2021-03-04 (×2): 10 meq via INTRAVENOUS
  Filled 2021-03-04: qty 100

## 2021-03-04 NOTE — ED Notes (Signed)
Elevated lactic acid 2.6  oa notified

## 2021-03-04 NOTE — ED Notes (Signed)
Charge rn notified of elevated labs and acuity increased

## 2021-03-04 NOTE — Progress Notes (Signed)
Elink Following Code Sepsis  

## 2021-03-04 NOTE — ED Triage Notes (Signed)
Fever for 3 days no pain  just delivered a abay  july21st  lmp every day since she delovered her baby  she last had tylenol at 1300 today

## 2021-03-04 NOTE — ED Notes (Signed)
Lab called an elevated pot also 2.7

## 2021-03-04 NOTE — ED Provider Notes (Signed)
[facility] EMERGENCY DEPARTMENT Provider Note   CSN: [id number] Arrival date & time: 03/04/21  1508     History Chief Complaint  Patient presents with   Fever    Chloe Gross is a 25 y.o. female.  This is 25 yo female spanish speaking patient presenting to ED for fevers/chills x3 days. She is s/p term delivery at 7/23 at [redacted]w[redacted]d, G2P2, delivery was vaginal, complicated by second-degree vaginal laceration which was repaired at time of delivery.  Patient reports 3 days of fevers and chills.  Subjective.  No other acute complaints offered.  No abdominal pain, nausea or vomiting.  No chest pain or difficulty breathing, no sick contact or recent travel.  She is experiencing scant lochia that is not foul smelling.  No vaginal pain.  Urinating appropriately.  No rashes.  No other acute complaints offered  The history is provided by the patient. The history is limited by a language barrier. A language interpreter was used.  Fever Associated symptoms: chills   Associated symptoms: no chest pain, no confusion, no cough, no headaches, no nausea, no rash and no vomiting       History reviewed. No pertinent past medical history.  Patient Active Problem List   Diagnosis Date Noted   Vaginal delivery 02/12/2021   Obstetrical laceration, second degree 02/12/2021   Indication for care or intervention in labor or delivery 02/11/2021   Supervision of low-risk pregnancy 08/04/2020   Language barrier 08/04/2020   Late prenatal care affecting pregnancy, antepartum 08/04/2020    History reviewed. No pertinent surgical history.   OB History     Gravida  1   Para  1   Term  1   Preterm      AB      Living  1      SAB      IAB      Ectopic      Multiple      Live Births  1           History reviewed. No pertinent family history.  Social History   Tobacco Use   Smoking status: Never   Smokeless tobacco: Never  Vaping Use   Vaping Use:  Never used  Substance Use Topics   Alcohol use: Not Currently    Comment: at times   Drug use: Never    Home Medications Prior to Admission medications   Medication Sig Start Date End Date Taking? Authorizing Provider  cephALEXin (KEFLEX) 500 MG capsule Take 1 capsule (500 mg total) by mouth 2 (two) times daily for 7 days. 03/04/21 03/11/21 Yes Tanda Rockers A, DO  potassium chloride SA (KLOR-CON) 20 MEQ tablet Take 1 tablet (20 mEq total) by mouth 2 (two) times daily for 4 days. 03/04/21 03/08/21 Yes Tanda Rockers A, DO  ibuprofen (ADVIL) 600 MG tablet Take 1 tablet (600 mg total) by mouth every 6 (six) hours. Patient not taking: Reported on 03/04/2021 02/14/21   Rolm Bookbinder, CNM  Prenatal Vit-Fe Fumarate-FA (PRENATAL VITAMINS PO) Take 1 tablet by mouth daily. Patient not taking: Reported on 03/04/2021    [provider]    Allergies    Patient has no known allergies.  Review of Systems   Review of Systems  Constitutional:  Positive for chills and fever.  HENT:  Negative for facial swelling and trouble swallowing.   Eyes:  Negative for photophobia and visual disturbance.  Respiratory:  Negative for cough and shortness of breath.  Cardiovascular:  Negative for chest pain and palpitations.  Gastrointestinal:  Negative for abdominal pain, nausea and vomiting.  Endocrine: Negative for polydipsia and polyuria.  Genitourinary:  Positive for vaginal bleeding. Negative for difficulty urinating, hematuria and vaginal pain.  Musculoskeletal:  Negative for gait problem and joint swelling.  Skin:  Negative for pallor and rash.  Neurological:  Negative for syncope and headaches.  Psychiatric/Behavioral:  Negative for agitation and confusion.    Physical Exam Updated Vital Signs BP 98/64   Pulse 78   Temp 98.5 F (36.9 C) (Oral)   Resp 20   Ht 4\' 9"  (1.448 m)   Wt 55 kg   LMP 03/24/2020   SpO2 100%   BMI 26.24 kg/m   Physical Exam Vitals and nursing note reviewed.   Constitutional:      General: She is not in acute distress.    Appearance: Normal appearance.  HENT:     Head: Normocephalic and atraumatic.     Right Ear: External ear normal.     Left Ear: External ear normal.     Nose: Nose normal.     Mouth/Throat:     Mouth: Mucous membranes are moist.  Eyes:     General: No scleral icterus.       Right eye: No discharge.        Left eye: No discharge.  Cardiovascular:     Rate and Rhythm: Normal rate and regular rhythm.     Pulses: Normal pulses.     Heart sounds: Normal heart sounds.  Pulmonary:     Effort: Pulmonary effort is normal. No respiratory distress.     Breath sounds: Normal breath sounds.  Abdominal:     General: Abdomen is flat.     Palpations: Abdomen is soft.     Tenderness: There is no abdominal tenderness.  Musculoskeletal:        General: Normal range of motion.     Cervical back: Normal range of motion.     Right lower leg: No edema.     Left lower leg: No edema.  Skin:    General: Skin is warm and dry.     Capillary Refill: Capillary refill takes less than 2 seconds.  Neurological:     Mental Status: She is alert.  Psychiatric:        Mood and Affect: Mood normal.        Behavior: Behavior normal.    ED Results / Procedures / Treatments   Labs (all labs ordered are listed, but only abnormal results are displayed) Labs Reviewed  CBC WITH DIFFERENTIAL/PLATELET - Abnormal; Notable for the following components:      Result Value   WBC 19.1 (*)    RBC 3.72 (*)    Hemoglobin 10.8 (*)    HCT 32.4 (*)    Neutro Abs 16.6 (*)    All other components within normal limits  COMPREHENSIVE METABOLIC PANEL - Abnormal; Notable for the following components:   Potassium 2.7 (*)    CO2 19 (*)    Glucose, Bld 145 (*)    BUN <5 (*)    Calcium 8.5 (*)    Albumin 2.8 (*)    Alkaline Phosphatase 127 (*)    All other components within normal limits  LACTIC ACID, PLASMA - Abnormal; Notable for the following components:    Lactic Acid, Venous 2.6 (*)    All other components within normal limits  LACTIC ACID, PLASMA - Abnormal; Notable for the following  components:   Lactic Acid, Venous 2.0 (*)    All other components within normal limits  URINALYSIS, ROUTINE W REFLEX MICROSCOPIC - Abnormal; Notable for the following components:   Color, Urine STRAW (*)    Specific Gravity, Urine 1.003 (*)    Hgb urine dipstick SMALL (*)    Protein, ur 30 (*)    Leukocytes,Ua LARGE (*)    Bacteria, UA RARE (*)    Non Squamous Epithelial 0-5 (*)    All other components within normal limits  CULTURE, BLOOD (ROUTINE X 2)  CULTURE, BLOOD (ROUTINE X 2)  URINE CULTURE  MISCELLANEOUS GENETIC TEST    EKG EKG Interpretation  Date/Time:  Wednesday March 04 2021 15:26:55 EDT Ventricular Rate:  142 PR Interval:  116 QRS Duration: 82 QT Interval:  280 QTC Calculation: 430 R Axis:   88 Text Interpretation: Sinus tachycardia Possible Left atrial enlargement T wave abnormality, consider inferior ischemia Abnormal ECG No old tracing to compare Confirmed by Dione BoozeGlick, David ([id number]) on 03/04/2021 11:05:40 PM  Radiology No results found.  Procedures Procedures   Medications Ordered in ED Medications  lactated ringers infusion ( Intravenous Stopped 03/04/21 2311)  ibuprofen (ADVIL) tablet 600 mg (600 mg Oral Given 03/04/21 1543)  potassium chloride 10 mEq in 100 mL IVPB (0 mEq Intravenous Stopped 03/04/21 2145)  acetaminophen (TYLENOL) tablet 650 mg (650 mg Oral Given 03/04/21 1952)  lactated ringers bolus 1,000 mL (0 mLs Intravenous Stopped 03/04/21 2145)  cefTRIAXone (ROCEPHIN) 1 g in sodium chloride 0.9 % 100 mL IVPB (0 g Intravenous Stopped 03/04/21 2034)  potassium chloride SA (KLOR-CON) CR tablet 40 mEq (40 mEq Oral Given 03/04/21 2311)    ED Course  I have reviewed the triage vital signs and the nursing notes.  Pertinent labs & imaging results that were available during my care of the patient were reviewed by me and  considered in my medical decision making (see chart for details).    MDM Rules/Calculators/A&P                           This is a 25 year old female Spanish-speaking patient presents to ER secondary to fever, chills.  Abdomen soft, nontender.  She is speaking clearly in full sentences.  Vital signs reviewed.  Serious etiology considered.  Labs obtained by triage, patient with elevated lactic acid, tachycardia.  Febrile. Leukocytosis. Patient with severe sepsis.  Likely urinary source.  Low suspicion for postpartum endometritis secondary to lack of foul-smelling lochia.  Lack of abdominal pain and onset of symptoms.  Without nausea or vomiting.  Pyelonephritis is less likely.  Blood cultures and urine culture obtained.  Patient started on broad-spectrum antibiotics.  IV fluids infusing. Culture sent prior to ABX.    Patient with UTI, she is able to tolerate PO, no nausea or emesis. No renal dysfunction, hemodynamically stable. She was offered observation however prefers to go home at this time. It is reasonable to trial outpatient antibiotic therapy at this time. Patient is agreeable.   She has hypokalemia, she was given potassium replacement in the ED, PO and IV. Will continue this as outpatient. Advised her to f/u with PCP in next 2-3 days for repeat potassium level to be drawn.   Advised patient to RTED if she is unable to see PCP for rpt potassium level.   The patient improved significantly and was discharged in stable condition. Detailed discussions were had with the patient regarding current findings, and need for  close f/u with PCP or on call doctor. The patient has been instructed to return immediately if the symptoms worsen in any way for re-evaluation. Patient verbalized understanding and is in agreement with current care plan. All questions answered prior to discharge.    Final Clinical Impression(s) / ED Diagnoses Final diagnoses:  Urinary tract infection without hematuria,  site unspecified  Hypokalemia    Rx / DC Orders ED Discharge Orders          Ordered    potassium chloride SA (KLOR-CON) 20 MEQ tablet  2 times daily        03/04/21 2240    cephALEXin (KEFLEX) 500 MG capsule  2 times daily        03/04/21 2240             Sloan Leiter, DO 03/04/21 2349

## 2021-03-04 NOTE — ED Provider Notes (Signed)
Emergency Medicine Provider Triage Evaluation Note  Chloe Gross , a 25 y.o. female  was evaluated in triage.  Pt complains of fever for the last 3 days. Just delivered a baby 7/21. Pt had covid in July 2022  Review of Systems  Positive: fever Negative: Nvd, abd pain, urinary sxs, cough, chest pain, sob  Physical Exam  BP 123/76 (BP Location: Left Arm)   Pulse (!) 147   Temp (!) 102.6 F (39.2 C) (Oral)   Resp 16   LMP 03/31/2020   SpO2 100%  Gen:   Awake, no distress   Resp:  Normal effort  MSK:   Moves extremities without difficulty  Other:  tachycardic  Medical Decision Making  Medically screening exam initiated at 3:32 PM.  Appropriate orders placed.  Chloe Gross was informed that the remainder of the evaluation will be completed by another provider, this initial triage assessment does not replace that evaluation, and the importance of remaining in the ED until their evaluation is complete.    Chloe Gross 03/04/21 1535    Bethann Berkshire, MD 03/08/21 1011

## 2021-03-04 NOTE — Sepsis Progress Note (Signed)
Notified provider of need to order antibiotics. Patient just returned to room, antibiotics being ordered and blood cultures to be drawn soon.

## 2021-03-05 ENCOUNTER — Telehealth: Payer: Self-pay

## 2021-03-05 NOTE — Telephone Encounter (Signed)
Transition Care Management Unsuccessful Follow-up Telephone Call  Date of discharge and from where:  03/04/2021-[facility]  Attempts:  1st Attempt  Reason for unsuccessful TCM follow-up call:  Unable to leave message

## 2021-03-06 NOTE — Telephone Encounter (Signed)
Transition Care Management Unsuccessful Follow-up Telephone Call  Date of discharge and from where:  03/04/2021-[facility]   Attempts:  2nd Attempt  Reason for unsuccessful TCM follow-up call:  Unable to leave message

## 2021-03-07 LAB — URINE CULTURE: Culture: >=100000 — AB

## 2021-03-09 ENCOUNTER — Telehealth: Payer: Self-pay

## 2021-03-09 LAB — CULTURE, BLOOD (ROUTINE X 2)
Culture: NO GROWTH
Culture: NO GROWTH
Special Requests: ADEQUATE
Special Requests: ADEQUATE

## 2021-03-09 NOTE — Telephone Encounter (Signed)
Interpreter ID: [id number] Liana Gerold)  Transition Care Management Follow-up Telephone Call Date of discharge and from where: 03/04/2021 from [facility] How have you been since you were released from the hospital? Pt stated that she is feeling better and has started the abx and the potassium supplement.  Any questions or concerns? No  Items Reviewed: Did the pt receive and understand the discharge instructions provided? Yes  Medications obtained and verified? Yes  Other? No  Any new allergies since your discharge? No  Dietary orders reviewed? No Do you have support at home? Yes   Functional Questionnaire: (I = Independent and D = Dependent) ADLs: I Bathing/Dressing- I Meal Prep- I Eating- I Maintaining continence- I Transferring/Ambulation- I Managing Meds- I   Follow up appointments reviewed:  PCP Hospital f/u appt confirmed? No   Specialist Hospital f/u appt confirmed? No   Are transportation arrangements needed? No  If their condition worsens, is the pt aware to call PCP or go to the Emergency Dept.? Yes Was the patient provided with contact information for the PCP's office or ED? Yes Was to pt encouraged to call back with questions or concerns? Yes

## 2021-03-09 NOTE — Telephone Encounter (Signed)
Post ED Visit - Positive Culture Follow-up  Culture report reviewed by antimicrobial stewardship pharmacist: [facility] []  , Pharm.D. []  Enzo Bi, Pharm.D., BCPS AQ-ID [x]  , Pharm.D., BCPS []  Celedonio Miyamoto, Pharm.D., BCPS []  [place], Delmar Landau.D., BCPS, AAHIVP []  , Pharm.D., BCPS, AAHIVP []  Georgina Pillion, PharmD, BCPS []  , PharmD, BCPS []  [place], PharmD, BCPS []  [address], PharmD []  , PharmD, BCPS []  Estella Husk, PharmD  Pharmacy Team []  Lysle Pearl, PharmD []  , PharmD []  Phillips Climes, PharmD []  , Rph []  Agapito Games) , PharmD []  Verlan Friends, PharmD []  , PharmD []  Mervyn Gay, PharmD []  , PharmD []  Vinnie Level, PharmD []  [facility], PharmD []  , PharmD []  Len Childs, PharmD   Positive urine culture Treated with Cephalexin, organism sensitive to the same and no further patient follow-up is required at this time.  03/09/2021, 9:34 AM

## 2021-08-21 DIAGNOSIS — Z113 Encounter for screening for infections with a predominantly sexual mode of transmission: Secondary | ICD-10-CM | POA: Diagnosis not present

## 2021-08-21 DIAGNOSIS — Z3009 Encounter for other general counseling and advice on contraception: Secondary | ICD-10-CM | POA: Diagnosis not present

## 2021-09-18 DIAGNOSIS — Z3200 Encounter for pregnancy test, result unknown: Secondary | ICD-10-CM | POA: Diagnosis not present

## 2021-11-06 DIAGNOSIS — Z3009 Encounter for other general counseling and advice on contraception: Secondary | ICD-10-CM | POA: Diagnosis not present

## 2021-11-06 DIAGNOSIS — Z3202 Encounter for pregnancy test, result negative: Secondary | ICD-10-CM | POA: Diagnosis not present

## 2021-11-20 DIAGNOSIS — Z3200 Encounter for pregnancy test, result unknown: Secondary | ICD-10-CM | POA: Diagnosis not present

## 2021-11-20 DIAGNOSIS — Z30017 Encounter for initial prescription of implantable subdermal contraceptive: Secondary | ICD-10-CM | POA: Diagnosis not present

## 2022-10-17 IMAGING — DX DG OR LOCAL ABDOMEN
1 series · 1 of 1 positions shown · non-contrast
Comparison: None.

CLINICAL DATA: Incorrect sponge count following delivery, initial
encounter

EXAM:
OR LOCAL ABDOMEN

[abdomen]
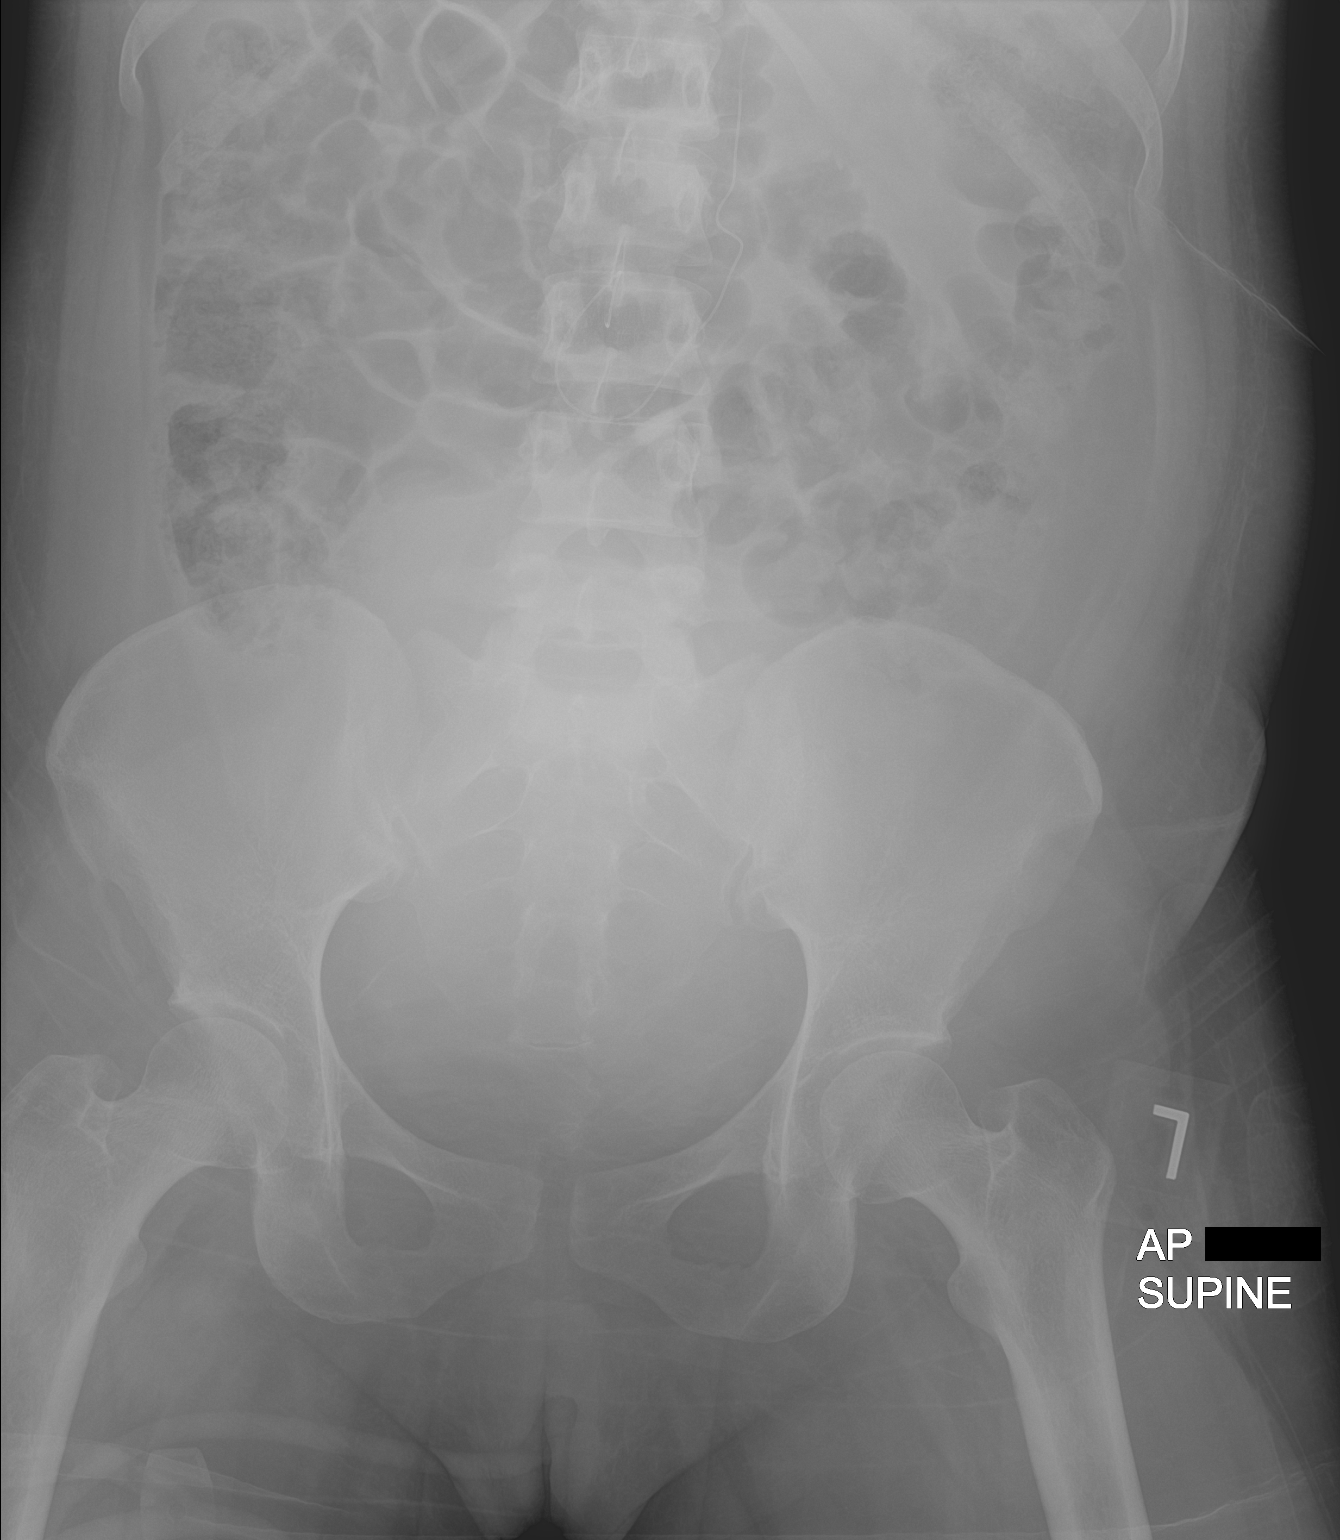

[1 of 1 positions shown; findings below may reference images not displayed]

FINDINGS: Scattered large and small bowel gas is noted. Prominent uterus
remains consistent with the recently postpartum state. Epidural
catheter is seen. No radiopaque foreign body is noted within the
abdomen. No other focal abnormality is seen.
IMPRESSION: No evidence of retained radiopaque sponge.

## 2022-11-17 ENCOUNTER — Telehealth: Payer: Self-pay

## 2022-11-17 NOTE — Telephone Encounter (Signed)
Called patient to schedule apt with PCP using interpreter # [phone]. Patient declined to schedule an appointment. AS, CMA
